# Patient Record
Sex: Female | Born: 1955 | Race: White | Hispanic: No | Marital: Married | Smoking: Never smoker
Health system: Southern US, Community
[De-identification: ages and names within clinical notes are randomized; demographics above are authoritative.]

## PROBLEM LIST (undated history)

## (undated) DIAGNOSIS — M7060 Trochanteric bursitis, unspecified hip: Secondary | ICD-10-CM

## (undated) DIAGNOSIS — G4710 Hypersomnia, unspecified: Secondary | ICD-10-CM

## (undated) DIAGNOSIS — G43909 Migraine, unspecified, not intractable, without status migrainosus: Secondary | ICD-10-CM

## (undated) DIAGNOSIS — N941 Unspecified dyspareunia: Secondary | ICD-10-CM

## (undated) DIAGNOSIS — G8929 Other chronic pain: Secondary | ICD-10-CM

## (undated) DIAGNOSIS — R519 Headache, unspecified: Secondary | ICD-10-CM

## (undated) DIAGNOSIS — K219 Gastro-esophageal reflux disease without esophagitis: Secondary | ICD-10-CM

## (undated) DIAGNOSIS — F419 Anxiety disorder, unspecified: Secondary | ICD-10-CM

## (undated) DIAGNOSIS — E559 Vitamin D deficiency, unspecified: Secondary | ICD-10-CM

## (undated) DIAGNOSIS — T7840XA Allergy, unspecified, initial encounter: Secondary | ICD-10-CM

## (undated) DIAGNOSIS — R51 Headache: Secondary | ICD-10-CM

## (undated) HISTORY — DX: Gastro-esophageal reflux disease without esophagitis: K21.9

## (undated) HISTORY — PX: WISDOM TOOTH EXTRACTION: SHX21

## (undated) HISTORY — DX: Anxiety disorder, unspecified: F41.9

## (undated) HISTORY — DX: Unspecified dyspareunia: N94.10

## (undated) HISTORY — DX: Other chronic pain: G89.29

## (undated) HISTORY — PX: OTHER SURGICAL HISTORY: SHX169

## (undated) HISTORY — DX: Headache: R51

## (undated) HISTORY — DX: Trochanteric bursitis, unspecified hip: M70.60

## (undated) HISTORY — DX: Allergy, unspecified, initial encounter: T78.40XA

## (undated) HISTORY — PX: LAPAROSCOPIC TOTAL HYSTERECTOMY: SUR800

## (undated) HISTORY — DX: Migraine, unspecified, not intractable, without status migrainosus: G43.909

## (undated) HISTORY — DX: Hypersomnia, unspecified: G47.10

## (undated) HISTORY — DX: Vitamin D deficiency, unspecified: E55.9

## (undated) HISTORY — PX: UPPER GASTROINTESTINAL ENDOSCOPY: SHX188

## (undated) HISTORY — PX: COLONOSCOPY: SHX174

## (undated) HISTORY — DX: Headache, unspecified: R51.9

---

## 1997-11-22 ENCOUNTER — Ambulatory Visit (HOSPITAL_COMMUNITY): Admission: RE | Admit: 1997-11-22 | Discharge: 1997-11-22 | Payer: Self-pay | Admitting: *Deleted

## 1998-05-07 ENCOUNTER — Ambulatory Visit (HOSPITAL_COMMUNITY): Admission: RE | Admit: 1998-05-07 | Discharge: 1998-05-07 | Payer: Self-pay | Admitting: Obstetrics & Gynecology

## 1998-05-07 ENCOUNTER — Encounter: Payer: Self-pay | Admitting: Obstetrics & Gynecology

## 2000-07-20 ENCOUNTER — Encounter: Payer: Self-pay | Admitting: Obstetrics & Gynecology

## 2000-07-20 ENCOUNTER — Ambulatory Visit (HOSPITAL_COMMUNITY): Admission: RE | Admit: 2000-07-20 | Discharge: 2000-07-20 | Payer: Self-pay | Admitting: Obstetrics & Gynecology

## 2001-02-15 ENCOUNTER — Other Ambulatory Visit: Admission: RE | Admit: 2001-02-15 | Discharge: 2001-02-15 | Payer: Self-pay | Admitting: Obstetrics & Gynecology

## 2003-06-11 ENCOUNTER — Other Ambulatory Visit: Admission: RE | Admit: 2003-06-11 | Discharge: 2003-06-11 | Payer: Self-pay | Admitting: Obstetrics & Gynecology

## 2004-11-25 ENCOUNTER — Other Ambulatory Visit: Admission: RE | Admit: 2004-11-25 | Discharge: 2004-11-25 | Payer: Self-pay | Admitting: Obstetrics & Gynecology

## 2005-01-29 ENCOUNTER — Ambulatory Visit: Payer: Self-pay | Admitting: Internal Medicine

## 2005-02-24 ENCOUNTER — Encounter
Admission: RE | Admit: 2005-02-24 | Discharge: 2005-02-24 | Payer: Self-pay | Admitting: Physical Medicine and Rehabilitation

## 2005-05-28 ENCOUNTER — Ambulatory Visit: Payer: Self-pay | Admitting: Internal Medicine

## 2006-06-10 ENCOUNTER — Ambulatory Visit (HOSPITAL_COMMUNITY): Admission: RE | Admit: 2006-06-10 | Discharge: 2006-06-10 | Payer: Self-pay | Admitting: Emergency Medicine

## 2007-01-18 ENCOUNTER — Encounter (INDEPENDENT_AMBULATORY_CARE_PROVIDER_SITE_OTHER): Payer: Self-pay | Admitting: Obstetrics & Gynecology

## 2007-01-18 ENCOUNTER — Ambulatory Visit (HOSPITAL_COMMUNITY): Admission: RE | Admit: 2007-01-18 | Discharge: 2007-01-19 | Payer: Self-pay | Admitting: Obstetrics & Gynecology

## 2010-09-01 NOTE — Op Note (Signed)
Michele [name], Michele [name]                 ACCOUNT NO.:  [id number]   MEDICAL RECORD NO.:  [id number]          PATIENT TYPE:  OIB   LOCATION:  9313                          FACILITY:  [facility]   PHYSICIAN:  Freddy Finner, M.D.   DATE OF BIRTH:  20-Jan-1956   DATE OF PROCEDURE:  DATE OF DISCHARGE:                               OPERATIVE REPORT   PREOPERATIVE DIAGNOSIS:  1. Uterine leiomyoma.  2. Suspected uterine adenomyosis by ultrasound findings.  3. Severe premenstrual disorder without hormonal medication and      antidepressants  4. Persistent right ovarian cyst.   POSTOPERATIVE DIAGNOSES:  1. Uterine leiomyoma.  2. Suspected uterine adenomyosis by ultrasound findings.  3. Severe premenstrual disorder without hormonal medication and      antidepressants  4. Persistent right ovarian cyst.   SECONDARY POSTOPERATIVE DIAGNOSES:  Peritoneal cyst of anterior  abdominal wall in the right upper quadrant.   OPERATIVE PROCEDURE:  Primary laparoscopically assisted vaginal  hysterectomy, bilateral salpingo-oophorectomy.   SECONDARY PROCEDURE:  Resection of right peritoneal upper abdominal  cyst.   ESTIMATED INTRAOPERATIVE BLOOD LOSS:  100 cc.   INTRAOPERATIVE COMPLICATIONS:  None.   ANESTHESIA:  General endotracheal.   DETAILS OF PRESENT ILLNESS:  Recorded in the admission note.  The  patient was admitted on the morning of surgery.  She was brought to the  operating room after receiving 1 g bolus of Ancef IV.  She was placed in  PAS hose.  She was brought to the operating room and there placed under  adequate endotracheal anesthesia.  Placed in the dorsal lithotomy  position with Allyn stirrups system.  Betadine prep of abdomen, perineum  and vagina was carried out in the standard fashion with Betadine scrub  followed by Betadine solution.  Bladder was evacuated with sterile  technique.  Hulka tenaculum was attached to the cervix under direct  visualization.  Sterile drapes were applied.   Two small incisions were  made, one at the umbilicus, one just about the symphysis.  An 11 mm  blade of disposable trocar was introduced at the upper incision while  elevating the anterior abdominal wall manually.  Direct inspection  revealed adequate placement, but no evidence of injury on entry.  The  peritoneum was allowed to accumulate with carbon dioxide gas.  A 5 mm  trocar was placed through a lower incision just above the symphysis  under direct visualization.  Again, no injury as encountered.  Systematic examination of the pelvic and abdominal contents was carried  out.  The only apparent upper abdominal abnormality was an approximately  2.5 x 2 cm clear-looking cystic structure attached to the peritoneum of  the anterior abdominal wall in a pedunculated fashion.  The appendix was  visualized which was normal.  The liver was normal.  No other  abnormalities were noted.  Pelvic contents revealed a boggy, irregular  enlarged uterus, cystically enlarged ovary, but there was no fixation,  no external excrescences.  No indication of malignancy.  The left ovary  and tube were normal.  The right tube was normal.  There was  a paratubal  cyst on the left nodule measuring approximately 1 cm.  Pelvic peritoneal  surfaces were clean.  Using the gyrus tripolar device, the cystic lesion  in the upper abdomen was fulgurated at its base and sharply dissected  free.  It was placed in the  cul-de-sac and retrieved during the vaginal  portion of the procedure and was sent separately for histologic  examination.  Elevating the uterus with the Hulka tenaculum and using a  spring loaded gasping forceps through the lower trocar sleeve for  traction and exposure, the right infundibular pelvic ligament and upper  broad ligament was progressively sealed and divided.  Left was then  treated essentially identically.  Hemostasis at this point was adequate.  Attention was turned vaginally.  Weighted posterior  vaginal retractor  was placed.  Beaver retractors were used laterally and anteriorly for  exposure.  The Hulka tenaculum was replaced with a Jacobs tenaculum.  Posterior weighted vaginal retractor was placed.  Mucosa posterior to  the cervix was tinted and entered sharply with Mayo scissors.  Cervix  was circumscribed with a scapula to release the cervix from the mucosa.  The cystic mass noted above was retrieved.  A banana posterior retractor  was then placed.  The LigaSure device was then used to seal and divide  the uterosacral pedicles on each side.  Lateral pelvis was similarly  sealed and divided to release the bladder from the cervix.  Careful  sharp and blunt dissection was then carried out to enter the anterior  peritoneal space which was confirmed by direct inspection.  Cardinal  ligament pedicles were then taken, sealed and divided with the LigaSure  device.  Vessel pedicles were taken, sealed and divided with the  LigaSure device.  Second pedicle was taken on each side above the  vessels.  This allowed delivery of the uterus, tubes and ovaries through  the vagina.  Angles of the vagina were then anchored to uterosacrals  with mattress sutures and 0-Monocryle.  Uterosacrals were plicated and  posterior peritoneum closed with interrupted 0-Monocryl.  Cuff was  closed vertically with figure of 8 0-Monocryl.  Foley catheter was  placed.  Reinspection was then carried out abdominally using the Nazat  irrigation system, and careful examination of all pedicles revealed  complete hemostasis.  This was confirmed under reduced intraabdominal  pressure.  The irrigating solution and gas were evacuated from the  abdominal cavity.  The skin incisions were anesthetized with 0.5% plain  Marcaine.  Incisions were closed with interrupted subcuticular sutures  of 3-0 Dexon.  Steri-Strips were applied to the lower incision, then  sterile dressing to the upper incision.  The patient was awakened  and  taken to the recovery room in good condition.      Freddy Finner, M.D.  Electronically Signed     WRN/MEDQ  D:  01/18/2007  T:  01/18/2007  Job:  [id number]

## 2010-09-01 NOTE — H&P (Signed)
Michele [name], Michele [name]                 ACCOUNT NO.:  [id number]   MEDICAL RECORD NO.:  [id number]          PATIENT TYPE:  AMB   LOCATION:  SDC                           FACILITY:  [facility]   PHYSICIAN:  Freddy Finner, M.D.   DATE OF BIRTH:  May 26, 1955   DATE OF ADMISSION:  01/17/2007  DATE OF DISCHARGE:                              HISTORY & PHYSICAL   ADMISSION DIAGNOSES:  Uterine leiomyomata.  Ultrasound findings  consistent with uterine adenomyosis.  Persistent cystic right adnexal  mass.  Significant premenstrual dysphoric disorder, somewhat related in  the past with oral contraceptives.   The patient is a 55 year old white married female, gravida 2, para 2;  who has significant gynecologic complaints and difficulty over time.  Primarily outlined as in the present illness.  There is persistence of a  right simple ovarian mass, which has been present essentially since  January 2008.  The patient has intermittently been somewhat controlled  with oral contraceptives for severe premenstrual dysphoric disorder; but  she has continued to have dysfunctional bleeding or intermittent  breakthrough bleeding, and significant premenstrual pain.  Given the  constellation of all of these symptoms, plus the ultrasound findings of  cyst, adenomyosis and fibroids, the patient has elected to proceed with  definitive surgical intervention and is admitted now for  laparoscopically-assisted vaginal hysterectomy and bilateral salpingo-  oophorectomy.   The potential risks of the procedures, including the possibility of  converting to an abdominal incision for completion of the procedure; the  potential for hemorrhage, infection and injury to other organs has been  discussed -- even though it was presented as unusual event.  She is now  noted and prepared to proceed with surgery.   Her current review of systems is otherwise negative.  There are no  specific cardiopulmonary, GI or GU complaints.   PAST  MEDICAL HISTORY:  The patient has no known significant medical  illnesses.  She does have mood disorders, which are cyclic and do  require additional medication.   MEDICATIONS:  1. Cymbalta 30 mg 1/60 mg.  2. Zegerid daily.  3. Ambien 10 mg at bedtime as needed for sleep.  4. Atenolol 50 mg daily for irregular heart rhythm.   She has given birth to 2 children vaginally.  She did have one  spontaneous incomplete AB, completed by D&[facility].   ALLERGIES:  NO KNOWN DRUG ALLERGIES.   She has had no blood transfusions.  She is not a cigarette smoker.  In  the past she had clinical findings consistent with mitral valve  prolapse, but those findings are not present on physical examination for  surgery.   FAMILY HISTORY:  Noncontributory.   PHYSICAL EXAMINATION:  HEENT:  Within normal limits.  NECK:  The thyroid gland is not palpably enlarged.  Blood pressure (in the office) 102/60.  CHEST:  Clear to auscultation.  HEART:  Normal sinus rhythm without murmurs, rubs or gallops.  Done on  examination day prior to surgery.  BREAST:  Examination is considered to be normal.  No palpable masses.  No skin change or nipple  discharge.  PELVIC:  External genitalia, vagina and cervix are normal to inspection.  Bimanual reveals the uterus to be irregular, slightly enlarged.  There  is a cystic fullness in the right adnexa.  Rectovaginal examination  confirms these findings.  EXTREMITIES:  Without clubbing, cyanosis or edema.   ASSESSMENT:  1. Uterine leiomyomata.  2. Probable uterine adenomyosis.  3. 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  4. Persistent simple appearing right ovarian cyst.  5. Premenstrual dysphoric disorder, which is obviously hormonally      mediated.   PLAN:  Laparoscopically-assisted vaginal hysterectomy and bilateral  salpingo-oophorectomy.      Freddy Finner, M.D.  Electronically Signed     WRN/MEDQ  D:  01/17/2007  T:  01/17/2007  Job:  [id number]

## 2010-09-04 NOTE — Discharge Summary (Signed)
[name], [name]                 ACCOUNT NO.:  [id number]   MEDICAL RECORD NO.:  [id number]          PATIENT TYPE:  OIB   LOCATION:  9313                          FACILITY:  [facility]   PHYSICIAN:  Freddy Finner, M.D.   DATE OF BIRTH:  [birth year]   DATE OF ADMISSION:  01/18/2007  DATE OF DISCHARGE:  01/19/2007                               DISCHARGE SUMMARY   DISCHARGE DIAGNOSES:  1. Persistent right ovarian mass which was a simple serous cyst.  2. Uterine adenomyosis.  3. Intramural leiomyomata.  4. Clinical symptoms of persistent right adnexal pain.  5. Significant premenstrual dysphoric disorder incompletely managed      with oral contraceptives.  6. Dysfunctional uterine bleeding.  7. Recurrent pelvic pain.   OPERATIVE PROCEDURE:  Laparoscopically-assisted vaginal hysterectomy,  bilateral salpingo-oophorectomy.   INTRAOPERATIVE AND POSTOPERATIVE COMPLICATIONS:  None.   DISPOSITION:  The patient was in satisfactory improved condition on the  first postoperative day, she was ambulating without assistance.  She was  having adequate bowel and bladder function.  She was tolerating a  regular diet.  Her condition was considered to be good.  She was  discharged home for follow-up in the office in approximately two weeks.  She was given Percocet 5/325 for pain.  She is to use adjuvant ibuprofen  with, or in place of, Percocet.  She is to continue her estrogen  replacement therapy.  She is to resume any and all preoperative  medications.   LABORATORY DATA DURING THIS ADMISSION:  A normal admission CBC,  postoperative hemoglobin on the first postoperative day was 11.2.  Admission prothrombin time and PTT were normal.   HOSPITAL COURSE:  The patient was admitted on the morning of surgery.  She was given perioperative Ancef.  She was placed in PAS serial  compression hose for the lower extremities.  She was brought to the  operating room where the above-described procedure was  accomplished  without difficulty.  There were no intraoperative complications.  Her  postoperative course was entirely satisfactory.  She remained afebrile  throughout her hospital stay.  By the first postoperative day, her  condition was good.  She was taking a regular diet.  She was discharged  home with further disposition as noted above.      Freddy Finner, M.D.  Electronically Signed     WRN/MEDQ  D:  04/29/2007  T:  04/30/2007  Job:  [id number]

## 2011-01-28 LAB — PROTIME-INR
INR: 0.9
Prothrombin Time: 12.1

## 2011-01-28 LAB — CBC
HCT: 32.8 — ABNORMAL LOW
HCT: 38.5
Hemoglobin: 11.2 — ABNORMAL LOW
Hemoglobin: 13.1
MCHC: 34
MCHC: 34.1
MCV: 94.5
MCV: 95
Platelets: 272
RBC: 3.47 — ABNORMAL LOW
RBC: 4.05
RDW: 12.5
RDW: 12.9
WBC: 7.5

## 2016-04-26 DIAGNOSIS — Z01419 Encounter for gynecological examination (general) (routine) without abnormal findings: Secondary | ICD-10-CM | POA: Diagnosis not present

## 2016-04-26 DIAGNOSIS — Z6822 Body mass index (BMI) 22.0-22.9, adult: Secondary | ICD-10-CM | POA: Diagnosis not present

## 2016-05-06 DIAGNOSIS — Z0000 Encounter for general adult medical examination without abnormal findings: Secondary | ICD-10-CM | POA: Diagnosis not present

## 2016-05-26 DIAGNOSIS — Z0000 Encounter for general adult medical examination without abnormal findings: Secondary | ICD-10-CM | POA: Diagnosis not present

## 2016-05-26 DIAGNOSIS — R0789 Other chest pain: Secondary | ICD-10-CM | POA: Diagnosis not present

## 2016-05-26 DIAGNOSIS — E559 Vitamin D deficiency, unspecified: Secondary | ICD-10-CM | POA: Diagnosis not present

## 2016-05-26 DIAGNOSIS — Z1389 Encounter for screening for other disorder: Secondary | ICD-10-CM | POA: Diagnosis not present

## 2016-05-26 DIAGNOSIS — Z23 Encounter for immunization: Secondary | ICD-10-CM | POA: Diagnosis not present

## 2016-07-16 DIAGNOSIS — H2513 Age-related nuclear cataract, bilateral: Secondary | ICD-10-CM | POA: Diagnosis not present

## 2016-07-16 DIAGNOSIS — H04123 Dry eye syndrome of bilateral lacrimal glands: Secondary | ICD-10-CM | POA: Diagnosis not present

## 2016-07-16 DIAGNOSIS — H25013 Cortical age-related cataract, bilateral: Secondary | ICD-10-CM | POA: Diagnosis not present

## 2016-08-20 DIAGNOSIS — M859 Disorder of bone density and structure, unspecified: Secondary | ICD-10-CM | POA: Diagnosis not present

## 2016-09-20 ENCOUNTER — Other Ambulatory Visit: Payer: Self-pay | Admitting: Internal Medicine

## 2016-09-20 DIAGNOSIS — R51 Headache: Principal | ICD-10-CM

## 2016-09-20 DIAGNOSIS — R519 Headache, unspecified: Secondary | ICD-10-CM

## 2016-10-02 ENCOUNTER — Ambulatory Visit
Admission: RE | Admit: 2016-10-02 | Discharge: 2016-10-02 | Disposition: A | Discharge status: Home / Self Care | Payer: 59 | Source: Ambulatory Visit | Attending: Internal Medicine | Admitting: Internal Medicine

## 2016-10-02 DIAGNOSIS — R51 Headache: Principal | ICD-10-CM

## 2016-10-02 DIAGNOSIS — R519 Headache, unspecified: Secondary | ICD-10-CM

## 2016-11-19 DIAGNOSIS — G43109 Migraine with aura, not intractable, without status migrainosus: Secondary | ICD-10-CM | POA: Diagnosis not present

## 2016-11-19 DIAGNOSIS — G43709 Chronic migraine without aura, not intractable, without status migrainosus: Secondary | ICD-10-CM | POA: Diagnosis not present

## 2017-01-03 DIAGNOSIS — Z23 Encounter for immunization: Secondary | ICD-10-CM | POA: Diagnosis not present

## 2017-03-07 DIAGNOSIS — Z23 Encounter for immunization: Secondary | ICD-10-CM | POA: Diagnosis not present

## 2017-05-05 DIAGNOSIS — Z01419 Encounter for gynecological examination (general) (routine) without abnormal findings: Secondary | ICD-10-CM | POA: Diagnosis not present

## 2017-05-05 DIAGNOSIS — Z6821 Body mass index (BMI) 21.0-21.9, adult: Secondary | ICD-10-CM | POA: Diagnosis not present

## 2017-05-05 DIAGNOSIS — Z1231 Encounter for screening mammogram for malignant neoplasm of breast: Secondary | ICD-10-CM | POA: Diagnosis not present

## 2018-02-03 DIAGNOSIS — R82998 Other abnormal findings in urine: Secondary | ICD-10-CM | POA: Diagnosis not present

## 2018-02-03 DIAGNOSIS — Z0000 Encounter for general adult medical examination without abnormal findings: Secondary | ICD-10-CM | POA: Diagnosis not present

## 2018-02-09 DIAGNOSIS — Z0000 Encounter for general adult medical examination without abnormal findings: Secondary | ICD-10-CM | POA: Diagnosis not present

## 2018-02-09 DIAGNOSIS — G43909 Migraine, unspecified, not intractable, without status migrainosus: Secondary | ICD-10-CM | POA: Diagnosis not present

## 2018-02-09 DIAGNOSIS — R0789 Other chest pain: Secondary | ICD-10-CM | POA: Diagnosis not present

## 2018-02-09 DIAGNOSIS — Z1389 Encounter for screening for other disorder: Secondary | ICD-10-CM | POA: Diagnosis not present

## 2018-02-09 DIAGNOSIS — N941 Unspecified dyspareunia: Secondary | ICD-10-CM | POA: Diagnosis not present

## 2018-03-03 DIAGNOSIS — Z1212 Encounter for screening for malignant neoplasm of rectum: Secondary | ICD-10-CM | POA: Diagnosis not present

## 2018-03-16 IMAGING — MR MR HEAD W/O CM
10 series · 48 of 48 positions shown · non-contrast
Comparison: None.

CLINICAL DATA: Non intractable headache

EXAM:
MRI HEAD WITHOUT CONTRAST
TECHNIQUE: Multiplanar, multiecho pulse sequences of the brain and surrounding
structures were obtained without intravenous contrast.

[Series 2: t1_se_sag · sagittal · 5.0mm · 0.45mm/px · 3 of 21 slices shown]
[im 1/21]
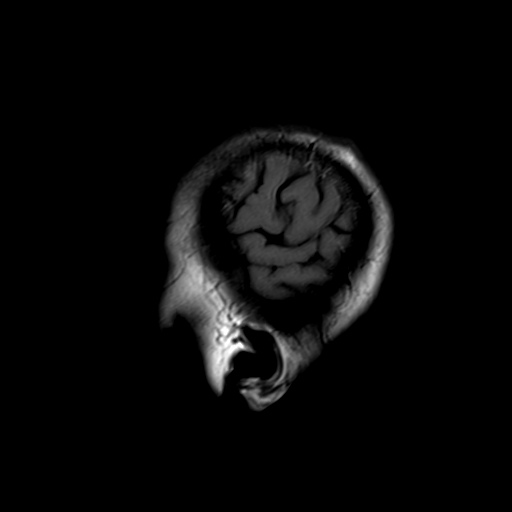
[im 11/21]
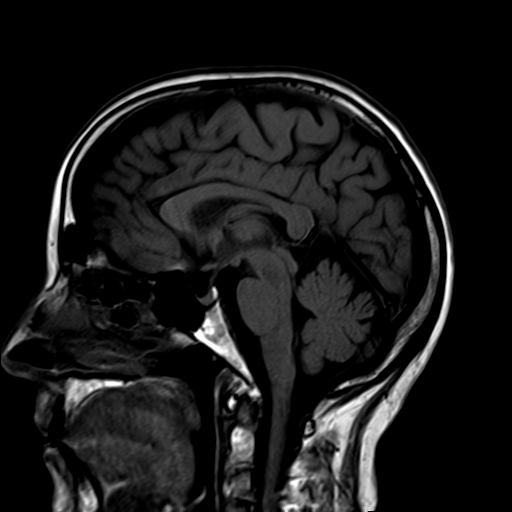
[im 21/21]
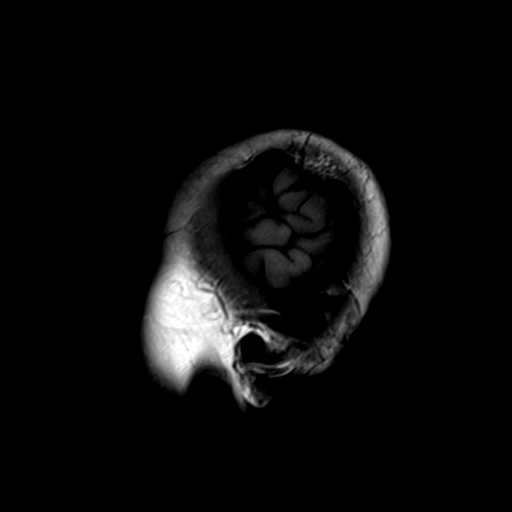

[Series 3: ep2d_diff_(id)_trace · axial · 3.0mm · 1.80mm/px · z∈[-43,+96]mm · 8 of 95 slices shown]
[im 1/95]
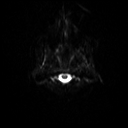
[im 14/95]
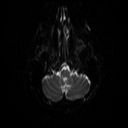
[im 27/95]
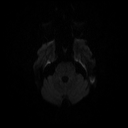
[im 41/95]
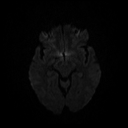
[im 54/95]
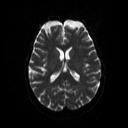
[im 68/95]
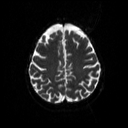
[im 81/95]
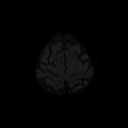
[im 95/95]
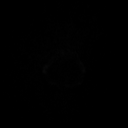

[Series 4: ep2d_diff_(id)_trace_adc · axial · 3.0mm · 1.80mm/px · z∈[-43,+96]mm · 4 of 48 slices shown]
[im 1/48]
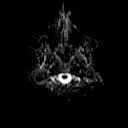
[im 16/48]
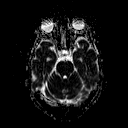
[im 32/48]
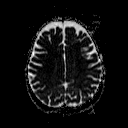
[im 48/48]
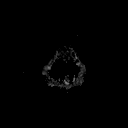

[Series 5: ep2d_diff_cor · coronal · 5.0mm · 1.77mm/px · 4 of 48 slices shown]
[im 1/48]
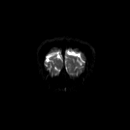
[im 16/48]
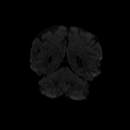
[im 32/48]
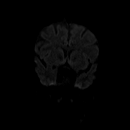
[im 48/48]
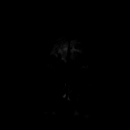

[Series 6: ep2d_diff_cor_adc · coronal · 5.0mm · 1.77mm/px · 2 of 24 slices shown]
[im 1/24]
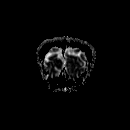
[im 24/24]
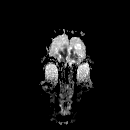

[Series 8: swi_images · axial · 2.0mm · 0.90mm/px · z∈[-53,+103]mm · 7 of 80 slices shown]
[im 1/80]
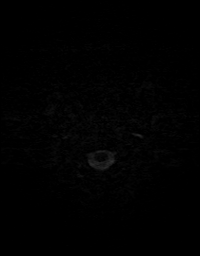
[im 14/80]
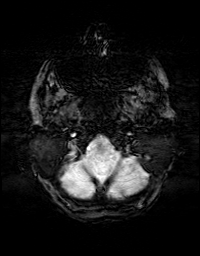
[im 27/80]
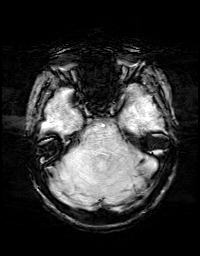
[im 40/80]
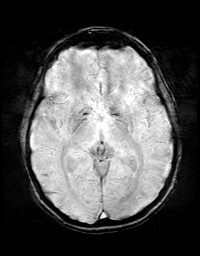
[im 53/80]
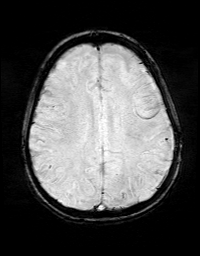
[im 66/80]
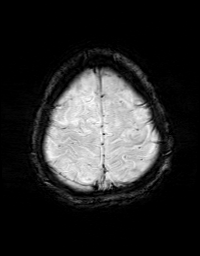
[im 80/80]
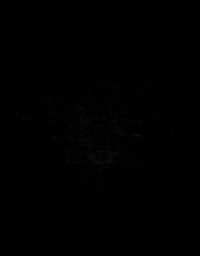

[Series 9: FLAIR · axial · 3.0mm · 0.43mm/px · z∈[-51,+103]mm · 2 of 27 slices shown]
[im 1/27]
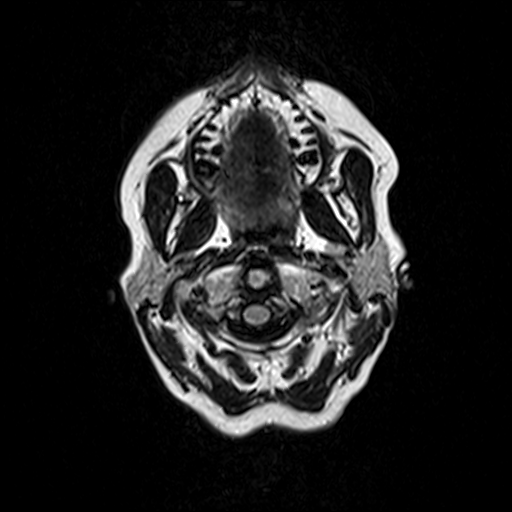
[im 27/27]
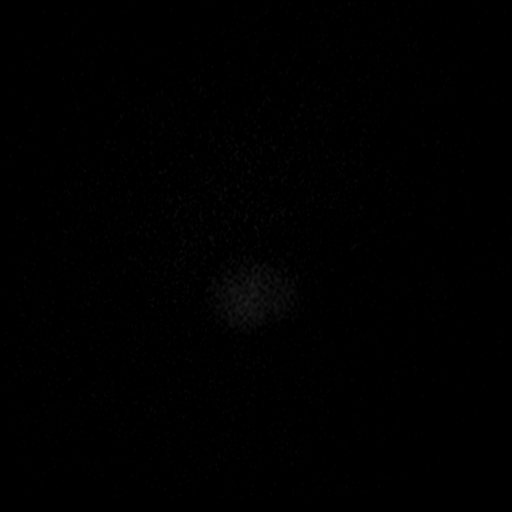

[Series 10: t2_tse_tra_512 · axial · 5.0mm · 0.60mm/px · z∈[-43,+93]mm · 2 of 24 slices shown]
[im 1/24]
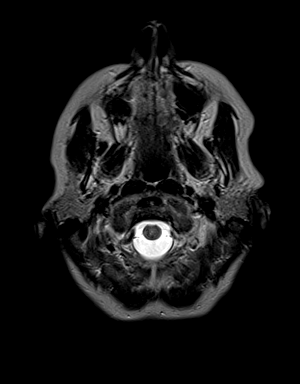
[im 24/24]
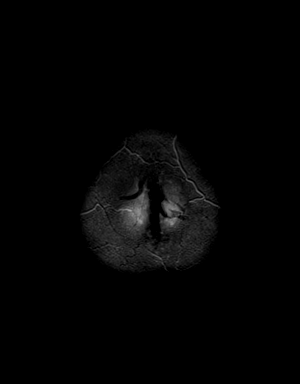

[Series 11: t1_mpr_tra · axial · 1.0mm · 0.72mm/px · z∈[-53,+104]mm · 14 of 160 slices shown]
[im 1/160]
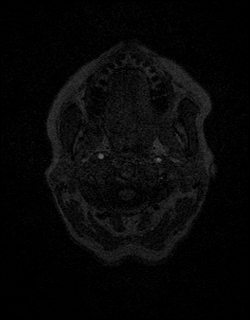
[im 13/160]
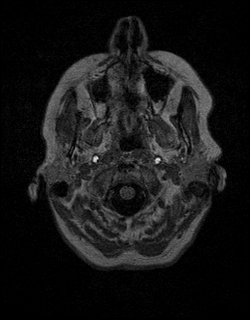
[im 25/160]
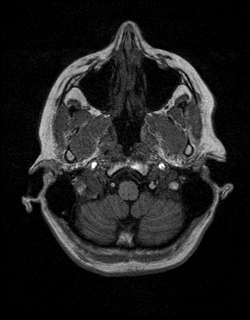
[im 37/160]
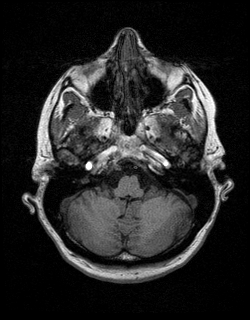
[im 49/160]
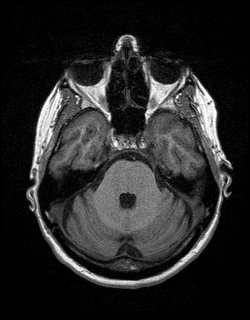
[im 62/160]
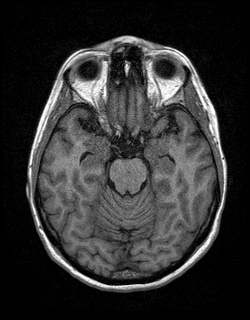
[im 74/160]
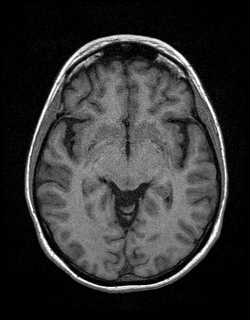
[im 86/160]
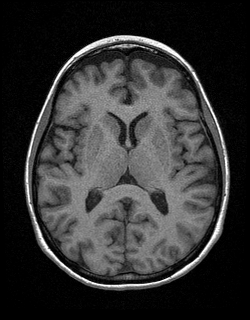
[im 98/160]
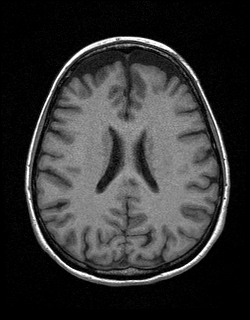
[im 111/160]
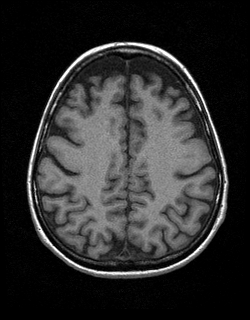
[im 123/160]
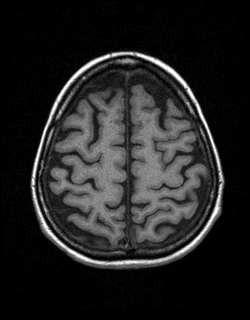
[im 135/160]
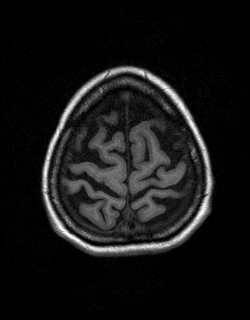
[im 147/160]
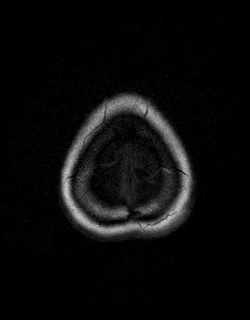
[im 160/160]
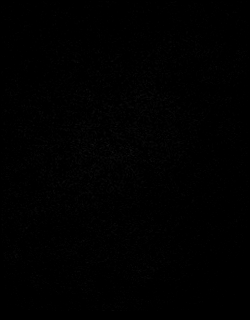

[Series 12: T2 · coronal · 5.0mm · 0.45mm/px · 2 of 26 slices shown]
[im 1/26]
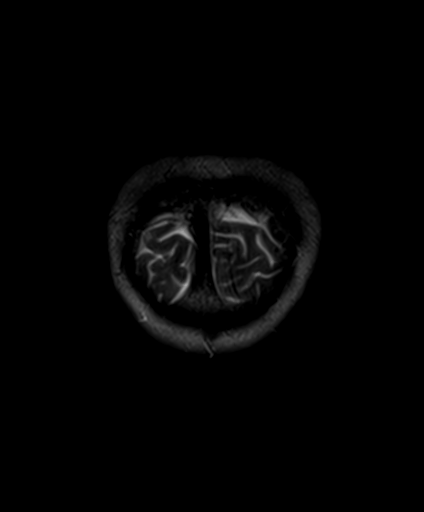
[im 26/26]
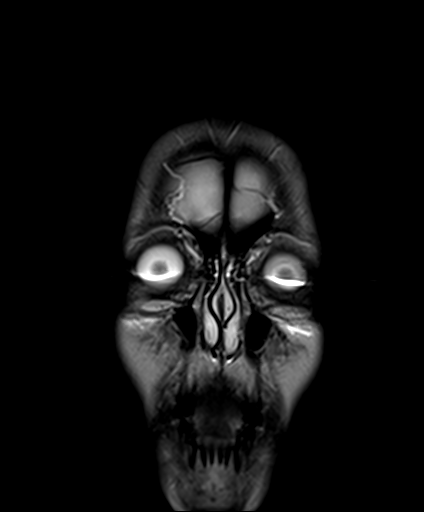

[48 of 48 positions shown; findings below may reference images not displayed]

FINDINGS: Brain: No acute infarction, hemorrhage, hydrocephalus, extra-axial
collection or mass lesion.

Vascular: Normal flow voids.

Skull and upper cervical spine: Negative

Sinuses/Orbits: Negative

Other: None
IMPRESSION: Negative MRI head

## 2018-07-11 DIAGNOSIS — G43709 Chronic migraine without aura, not intractable, without status migrainosus: Secondary | ICD-10-CM | POA: Diagnosis not present

## 2018-07-11 DIAGNOSIS — G43109 Migraine with aura, not intractable, without status migrainosus: Secondary | ICD-10-CM | POA: Diagnosis not present

## 2018-07-19 ENCOUNTER — Encounter: Payer: Self-pay | Admitting: *Deleted

## 2018-07-19 ENCOUNTER — Telehealth: Payer: Self-pay | Admitting: *Deleted

## 2018-07-19 NOTE — Telephone Encounter (Signed)
Called pt & LVM asking for call back. Wanted to review patient's information in chart (I.e. meds, allergies, med/sx/fam hx, etc). Left office number in message.

## 2018-07-20 ENCOUNTER — Ambulatory Visit (INDEPENDENT_AMBULATORY_CARE_PROVIDER_SITE_OTHER): Payer: 59 | Admitting: Neurology

## 2018-07-20 ENCOUNTER — Encounter: Payer: Self-pay | Admitting: *Deleted

## 2018-07-20 ENCOUNTER — Other Ambulatory Visit: Payer: Self-pay

## 2018-07-20 DIAGNOSIS — G43711 Chronic migraine without aura, intractable, with status migrainosus: Secondary | ICD-10-CM

## 2018-07-20 MED ORDER — VENLAFAXINE HCL ER 75 MG PO CP24: 75.0000 mg | ORAL_CAPSULE | Freq: Every day | ORAL | 4 refills | Status: DC

## 2018-07-20 MED ORDER — METHYLPREDNISOLONE 4 MG PO TBPK: ORAL_TABLET | ORAL | 1 refills | Status: DC

## 2018-07-20 NOTE — Telephone Encounter (Signed)
We received the synopsis via fax and will give to Dr. Lucia Gaskins for review.

## 2018-07-20 NOTE — Telephone Encounter (Signed)
She would also like to confirm that Dr. Lucia Gaskins received the synopsis of her medical history, she would like for this to be reviewed before her visit.

## 2018-07-20 NOTE — Telephone Encounter (Signed)
Patient returned call she does not have an email regarding her web ex and needs the link sent to her. She downloaded web ex on her own but cannot join with our doctor. [email or web address].

## 2018-07-20 NOTE — Progress Notes (Signed)
[facility]    Provider:  Dr Lucia Gaskins Requesting Provider: Rodrigo Ran, MD Primary Care Provider:  Rodrigo Ran, MD  CC:  Intractable migraines  Virtual Visit via Video Note  I connected with@ on 07/24/18 at  3:30 PM EDT by a video enabled telemedicine application and verified that I am speaking with the correct person using two identifiers. Patient is at her work office and physician is in the office as well.    I discussed the limitations of evaluation and management by telemedicine and the availability of in person appointments. The patient expressed understanding and agreed to proceed.  Anson Fret, MD  HPI:  Michele [name] is a 63 y.o. female here as requested by Rodrigo Ran, MD for migraines. Patient has had migraines for years and has tried many medications. Currently, she has had a 9-week migraine that waxes and wanes but is daily. She has missed work seeing patients 3 times She has been in status migrainosus in the past but  this is the first time she has had a migraine this long and intractable. The last time she had imaging was in 2018 and normal. The migraine quality can be different, she does not have auras and she has tried all the triptans. Migraines are unilateral through the eye, pulsating and pounding, photo/phonophobia, movement makes it worse. Light is a trigger. Other times they can be all over the head. +nausea often, when she does get nausea she is incapacitated 3x in the last 9 weeks. Migraines are severe or moderately severe.  Atenolol helps.  She has 18 headache days a month on average and 10 migraine days a month for longer than one year. Over the last 9 weeks daily migraines. She tried aimovig for 3 months last fall. She tried Emgality 2 doses and then the headache started for 9 weeks. She is not on CGRP at this time. She tried baclofen and compazine but could not tolerate it. Compazine made her stomach feel worse. Toradol makes her sick. She is  not tolerant of any of the triptans. No medication overuse. Migraines can last a minimum of 24 hours.  No other focal neurologic deficits, associated symptoms, inciting events or modifiable factors.   Meds tried (this is not a complete list) : Zofran, tizanidine, atenolol, celexa, topiramate, propranolol, lopressor, phenergan, flexeril, lamictal, cymbalta, effexor, prozac, viibryd  Reviewed notes, labs and imaging from outside physicians, which showed:  She also takes Zofran  for nausea Tizanidine  for aches and pains and is helpful Atenolol  Celexa  QD  Current and past medications ANALGESICS:demerol,tylenol,ultram,vicodin ANTI-MIGRAINE: axert, cafergot, DHE-45, imitrex tablet/injection, maxalt, midrin. She tells me she does not tolerate triptans well.  HEART/BP: lopressor, atenolol (very helpful) DECONGESTANT/ANTIHISTAMINE: allegra, clarinex, zyrtec ANTI-NAUSEANT: phenergan, vistaril, zofran NSAIDS: ibuprofen, naproxen, mobic, toradol MUSCLE RELAXANTS: amrix, flexeril, tizanidine ANTI-CONVULSANTS: topamax (expressive aphasia), Klonopin, Lamictal STEROIDS: medrol SLEEPING PILLS/TRANQUILIZERS:ambien, melatonin, xanax  ANTI-DEPRESSANTS: celexa, cymbalta, effexor (increased anxiety) pristiq, prozac, viibryd HERBAL: magnesium citrate (GI upset) FIBROMYALGIA:  HORMONAL: OTHER:  PROCEDURES FOR HEADACHES:   Review of Systems: Patient complains of symptoms per HPI as well as the following symptoms: headaches, nausea. Pertinent negatives and positives per HPI. All others negative.   Social History   Socioeconomic History   Marital status: Married    Spouse name: Not on file   Number of children: 2   Years of education: Not on file   Highest education level: Master's degree (e.g., MA, MS, MEng, MEd, MSW, MBA)  Occupational  History   Not on file  Social Needs   Financial resource strain: Not on file   Food insecurity:    Worry: Not on file    Inability: Not  on file   Transportation needs:    Medical: Not on file    Non-medical: Not on file  Tobacco Use   Smoking status: Never Smoker   Smokeless tobacco: Never Used  Substance and Sexual Activity   Alcohol use: Never    Frequency: Never   Drug use: Never   Sexual activity: Not on file  Lifestyle   Physical activity:    Days per week: Not on file    Minutes per session: Not on file   Stress: Not on file  Relationships   Social connections:    Talks on phone: Not on file    Gets together: Not on file    Attends religious service: Not on file    Active member of club or organization: Not on file    Attends meetings of clubs or organizations: Not on file    Relationship status: Not on file   Intimate partner violence:    Fear of current or ex partner: Not on file    Emotionally abused: Not on file    Physically abused: Not on file    Forced sexual activity: Not on file  Other Topics Concern   Not on file  Social History Narrative   Lives at home with spouse   Right handed   Caffeine: 2 cups daily     Family History  Problem Relation Age of Onset   Lung cancer Father    Headache Father    Other Sister        ureter tumor, smoking related   Allergic Disorder Brother    Anxiety disorder Brother     Past Medical History:  Diagnosis Date   Anxiety    Chronic headaches    Dyspareunia in female    Greater trochanteric bursitis    bilateral   Hypersomnia    Migraines    Vitamin D deficiency    in the past per pt    There are no active problems to display for this patient.   Past Surgical History:  Procedure Laterality Date   COLONOSCOPY     endoscopy     LAPAROSCOPIC TOTAL HYSTERECTOMY     WISDOM TOOTH EXTRACTION      Current Outpatient Medications  Medication Sig Dispense Refill   ALPRAZolam (XANAX) 1 MG tablet Take 0.5 mg by mouth as needed for anxiety.     Armodafinil 250 MG tablet Take 125 mg by mouth 2 (two) times daily.       atenolol (TENORMIN) 100 MG tablet Take 1 tablet (100 mg total) by mouth daily. 90 tablet 11   citalopram (CELEXA) 40 MG tablet Take 40 mg by mouth at bedtime.     estradiol (VIVELLE-DOT) 0.05 MG/24HR patch Place 1 patch onto the skin 2 (two) times a week.     methylPREDNISolone (MEDROL DOSEPAK) 4 MG TBPK tablet Take pills together each morning with food for 6 days 21 tablet 1   ondansetron (ZOFRAN) 4 MG tablet Take 4 mg by mouth as needed for nausea or vomiting.     traMADol (ULTRAM) 50 MG tablet Take 50 mg by mouth as needed (pain).     Ubrogepant (UBRELVY) 50 MG TABS Take 100 mg by mouth every 2 (two) hours as needed. Max  a day 20 tablet 6  venlafaxine XR (EFFEXOR XR) 75 MG 24 hr capsule Take 1 capsule (75 mg total) by mouth daily with breakfast. 90 capsule 4   No current facility-administered medications for this visit.     Allergies as of 07/20/2018   (No Known Allergies)    Vitals: There were no vitals taken for this visit. Last Weight:  Wt Readings from Last 1 Encounters:  07/20/18 125 lb (56.7 kg)   Last Height:   Ht Readings from Last 1 Encounters:  07/20/18 5\' 6"  (1.676 m)     Physical exam: Exam:    Physical exam: Exam: Gen: NAD, conversant      CV: attempted, Could not perform over Web Video  Eyes: Conjunctivae clear without exudates or hemorrhage  Neuro: Detailed Neurologic Exam  Speech:    Speech is normal; fluent and spontaneous with normal comprehension.  Cognition:    The patient is oriented to person, place, and time;     recent and remote memory intact;     language fluent;     normal attention, concentration,     fund of knowledge Cranial Nerves:    The pupils are equal, round, and reactive to light. Attempted, Cannot perform fundoscopic exam. Visual fields are full to finger confrontation. Extraocular movements are intact.  The face is symmetric with normal sensation. The palate elevates in the midline. Hearing intact. Voice is  normal. Shoulder shrug is normal. The tongue has normal motion without fasciculations.   Coordination:    Normal finger to nose  Gait:    Normal native gait  Motor Observation:   no involuntary movements noted. Tone:    Appears normal  Posture:    Posture is normal. normal erect    Strength:    Strength is anti-gravity and symmetric in the upper and lower limbs.      Sensation: intact to LT     Reflex Exam:  DTR's:    Attempted, Could not perform over Web Video   Toes: Attempted Could not perform over Web Video  Clonus:   Attempted, Could not perform over Web Video     Assessment/Plan:  63 year old patient with intractable headaches. FAILED CGRP(Aimovig, Emgality) and is no long on these meds.  Meds tried (this is not a complete list) : Zofran, tizanidine, atenolol, celexa, topiramate, propranolol, lopressor, phenergan, flexeril, lamictal, cymbalta, effexor, prozac, viibryd   - Patient is an excellent candidate for botox, we will request authorization. - FAILED CGRP, not on them (can consider Ajovy* in the future) - Does not tolerate triptans. Prescribe Bernita Raisin - Increase Atenolol to 100mg  - change celexa to effexor.  Follow Up Instructions:    I discussed the assessment and treatment plan with the patient. The patient was provided an opportunity to ask questions and all were answered. The patient agreed with the plan and demonstrated an understanding of the instructions.   The patient was advised to call back or seek an in-person evaluation if the symptoms worsen or if the condition fails to improve as anticipated.   Meds ordered this encounter  Medications   venlafaxine XR (EFFEXOR XR) 75 MG 24 hr capsule    Sig: Take 1 capsule (75 mg total) by mouth daily with breakfast.    Dispense:  90 capsule    Refill:  4   methylPREDNISolone (MEDROL DOSEPAK) 4 MG TBPK tablet    Sig: Take pills together each morning with food for 6 days    Dispense:  21 tablet  Refill:  1   DISCONTD: Ubrogepant (UBRELVY) 50 MG TABS    Sig: Take 100 mg by mouth every 2 (two) hours as needed. Max 200mg  a day    Dispense:  20 tablet    Refill:  6    Patient has a copay card can have medication regardless of insurance coverage. If copay card only works for 10 tabs then it is ok to dispense 10 instead of 20.   Ubrogepant (UBRELVY) 50 MG TABS    Sig: Take 100 mg by mouth every 2 (two) hours as needed. Max 200mg  a day    Dispense:  20 tablet    Refill:  6    Patient has a copay card can have medication regardless of insurance coverage. If copay card only works for 10 tabs then it is ok to dispense 10 instead of 20.   DISCONTD: atenolol (TENORMIN) 100 MG tablet    Sig: Take 1 tablet (100 mg total) by mouth daily.    Dispense:  90 tablet    Refill:  11   atenolol (TENORMIN) 100 MG tablet    Sig: Take 1 tablet (100 mg total) by mouth daily.    Dispense:  90 tablet    Refill:  11    Cc: Rodrigo Ran, MD   Discussed: To prevent or relieve headaches, try the following: Cool Compress. Lie down and place a cool compress on your head.  Avoid headache triggers. If certain foods or odors seem to have triggered your migraines in the past, avoid them. A headache diary might help you identify triggers.  Include physical activity in your daily routine. Try a daily walk or other moderate aerobic exercise.  Manage stress. Find healthy ways to cope with the stressors, such as delegating tasks on your to-do list.  Practice relaxation techniques. Try deep breathing, yoga, massage and visualization.  Eat regularly. Eating regularly scheduled meals and maintaining a healthy diet might help prevent headaches. Also, drink plenty of fluids.  Follow a regular sleep schedule. Sleep deprivation might contribute to headaches Consider biofeedback. With this mind-body technique, you learn to control certain bodily functions -- such as muscle tension, heart rate and blood pressure -- to  prevent headaches or reduce headache pain.    Proceed to emergency room if you experience new or worsening symptoms or symptoms do not resolve, if you have new neurologic symptoms or if headache is severe, or for any concerning symptom.   Provided education and documentation from American headache Society toolbox including articles on: chronic migraine medication overuse headache, chronic migraines, prevention of migraines, behavioral and other nonpharmacologic treatments for headache.   A total of 60 minutes was spent Video face-to-face(not in person) with this patient. Over half this time was spent on counseling patient on the  1. Chronic migraine without aura, with intractable migraine, so stated, with status migrainosus    diagnosis and different diagnostic and therapeutic options, counseling and coordination of care, risks ans benefits of management, compliance, or risk factor reduction and education.     Naomie Dean, MD  [facility] [address] [place], [place] [address]  Phone [phone] Fax [phone]

## 2018-07-20 NOTE — Telephone Encounter (Signed)
Spoke with pt and confirmed pt using 2 identifiers. Updated her med/sx/soc/fam hx, meds, allergies, weight/height updated in chart. Pt faxing over her synopsis for Dr. Lucia Gaskins to review asap.

## 2018-07-24 ENCOUNTER — Ambulatory Visit: Payer: 59 | Admitting: Neurology

## 2018-07-24 ENCOUNTER — Telehealth: Payer: Self-pay | Admitting: Neurology

## 2018-07-24 ENCOUNTER — Other Ambulatory Visit: Payer: Self-pay

## 2018-07-24 ENCOUNTER — Encounter: Payer: Self-pay | Admitting: Neurology

## 2018-07-24 VITALS — BP 118/71 | HR 69 | Temp 99.3°F

## 2018-07-24 DIAGNOSIS — R51 Headache with orthostatic component, not elsewhere classified: Secondary | ICD-10-CM

## 2018-07-24 DIAGNOSIS — M7918 Myalgia, other site: Secondary | ICD-10-CM | POA: Diagnosis not present

## 2018-07-24 DIAGNOSIS — G441 Vascular headache, not elsewhere classified: Secondary | ICD-10-CM

## 2018-07-24 DIAGNOSIS — G43711 Chronic migraine without aura, intractable, with status migrainosus: Secondary | ICD-10-CM | POA: Insufficient documentation

## 2018-07-24 DIAGNOSIS — H538 Other visual disturbances: Secondary | ICD-10-CM

## 2018-07-24 DIAGNOSIS — H532 Diplopia: Secondary | ICD-10-CM

## 2018-07-24 DIAGNOSIS — R519 Headache, unspecified: Secondary | ICD-10-CM

## 2018-07-24 DIAGNOSIS — M542 Cervicalgia: Secondary | ICD-10-CM | POA: Diagnosis not present

## 2018-07-24 DIAGNOSIS — G4484 Primary exertional headache: Secondary | ICD-10-CM

## 2018-07-24 DIAGNOSIS — G8929 Other chronic pain: Secondary | ICD-10-CM

## 2018-07-24 DIAGNOSIS — G43001 Migraine without aura, not intractable, with status migrainosus: Secondary | ICD-10-CM | POA: Insufficient documentation

## 2018-07-24 DIAGNOSIS — H5713 Ocular pain, bilateral: Secondary | ICD-10-CM

## 2018-07-24 MED ORDER — ATENOLOL 100 MG PO TABS: 100.0000 mg | ORAL_TABLET | Freq: Every day | ORAL | 11 refills | Status: DC

## 2018-07-24 MED ORDER — UBROGEPANT 50 MG PO TABS: 100.0000 mg | ORAL_TABLET | ORAL | 6 refills | Status: DC | PRN

## 2018-07-24 NOTE — Progress Notes (Signed)
Nerve block w/o steroid: Pt signed consent  0.5% Bupivocaine 6 mL LOT: [id number] EXP: 11/2019 NDC: 70141-030-13  2% Lidocaine 6 mL LOT: [id number] EXP: 11/18/2018 NDC: 1438-8875-79 // Botox- 200 units x 1 vial Pt signed consent Lot: [id number] Expiration: 12/2019 NDC: 1561-5379-43  Bacteriostatic 0.9% Sodium Chloride- 48mL total Lot: [id number] Expiration: 01/18/2019 NDC: 0929-5747-34  Dx: G43.711 Samples used today

## 2018-07-24 NOTE — Telephone Encounter (Signed)
Bethany: Please fill out a form for Michele [name] to initiate botox for migraines  Danielle: I would like to schedule her for botox in 8-10 weeks thank you.

## 2018-07-24 NOTE — Progress Notes (Addendum)
[facility]    Provider:  Dr Lucia GaskinsAhern Requesting Provider: Rodrigo RanPerini, Mark, MD Primary Care Provider:  Rodrigo RanPerini, Mark, MD  CC:  Intractable migraines  Addendum 08/27/2018: Patient continues to suffer with uncharacteristic severe headaches and migraines which have been ongoing for months.  Despite adequate treatment, nerve blocks, increasing atenolol, migraine infusions, CGRP she continues to worsen she is having chronic daily headaches now, her vision is worsening becoming more blurry with diplopia, the headaches are becoming progressively more positional and exertional and occipital.  Patient needs an MRI of the brain with and without contrast as soon as possible.  Interval history 07/24/2018: Patient is being seen today in the office due to intractable severe headache and status migrainosus.  This is a very nice 63 year old patient with years of migraines (see below for history) and she is failed a tremendous amount of medications most first and second line migraine preventatives as well as many acute management medications such as triptans (for a full list see below).  She appears very uncomfortable today, she says her migraine is 10 out of 10.  Today we discussed multiple options, first was ways to break her migraine.  We also discussed acute management of her migraines in the future, and preventative medication.  Botox is the most appropriate way to proceed.  She has failed to CGRP medication and is currently not on them.  For more details on her migraines see history of present illness below.  Virtual Visit via Video Note 07/20/2018  I connected with@ on 07/24/18 at  3:30 PM EDT by a video enabled telemedicine application and verified that I am speaking with the correct person using two identifiers. Patient is at her work office and physician is in the office as well.    I discussed the limitations of evaluation and management by telemedicine and the availability of in person  appointments. The patient expressed understanding and agreed to proceed.  Anson FretAhern, Lexandra Rettke B, MD  HPI:  Michele [name] is a 63 y.o. female here as requested by Rodrigo RanPerini, Mark, MD for migraines. Patient has had migraines for years and has tried many medications. Currently, she has had a 9-week migraine that waxes and wanes but is daily. She has missed work seeing patients 3 times She has been in status migrainosus in the past but  this is the first time she has had a migraine this long and intractable. The last time she had imaging was in 2018 and normal. The migraine quality can be different, she does not have auras and she has tried all the triptans. Migraines are unilateral through the eye, pulsating and pounding, photo/phonophobia, movement makes it worse. Light is a trigger. Other times they can be all over the head. +nausea often, when she does get nausea she is incapacitated 3x in the last 9 weeks. Migraines are severe or moderately severe.  Atenolol helps.  She has 18 headache days a month on average and 10 migraine days a month for longer than one year. Over the last 9 weeks daily migraines. She tried aimovig for 3 months last fall. She tried Emgality 2 doses and then the headache started for 9 weeks. She is not on CGRP at this time. She tried baclofen and compazine but could not tolerate it. Compazine made her stomach feel worse. Toradol makes her sick. She is not tolerant of any of the triptans. No medication overuse. Migraines can last a minimum of 24 hours.  No other focal neurologic deficits, associated symptoms,  inciting events or modifiable factors.   Meds tried (this is not a complete list) : Zofran, tizanidine, atenolol, celexa, topiramate, propranolol, lopressor, phenergan, flexeril, lamictal, cymbalta, effexor, prozac, viibryd  Reviewed notes, labs and imaging from outside physicians, which showed:  She also takes Zofran 4mg  for nausea Tizanidine 2mg  for aches and pains and is  helpful Atenolol 50mg  Celexa 40mg  QD  Current and past medications ANALGESICS:demerol,tylenol,ultram,vicodin ANTI-MIGRAINE: axert, cafergot, DHE-45, imitrex tablet/injection, maxalt, midrin. She tells me she does not tolerate triptans well.  HEART/BP: lopressor, atenolol (very helpful) DECONGESTANT/ANTIHISTAMINE: allegra, clarinex, zyrtec ANTI-NAUSEANT: phenergan, vistaril, zofran NSAIDS: ibuprofen, naproxen, mobic, toradol MUSCLE RELAXANTS: amrix, flexeril, tizanidine ANTI-CONVULSANTS: topamax (expressive aphasia), Klonopin, Lamictal STEROIDS: medrol SLEEPING PILLS/TRANQUILIZERS:ambien, melatonin, xanax  ANTI-DEPRESSANTS: celexa, cymbalta, effexor (increased anxiety) pristiq, prozac, viibryd HERBAL: magnesium citrate (GI upset) FIBROMYALGIA:  HORMONAL: OTHER:  PROCEDURES FOR HEADACHES:   Review of Systems: Patient complains of symptoms per HPI as well as the following symptoms: headaches, nausea. Pertinent negatives and positives per HPI. All others negative.   Social History   Socioeconomic History   Marital status: Married    Spouse name: Not on file   Number of children: 2   Years of education: Not on file   Highest education level: Master's degree (e.g., MA, MS, MEng, MEd, MSW, MBA)  Occupational History   Not on file  Social Needs   Financial resource strain: Not on file   Food insecurity:    Worry: Not on file    Inability: Not on file   Transportation needs:    Medical: Not on file    Non-medical: Not on file  Tobacco Use   Smoking status: Never Smoker   Smokeless tobacco: Never Used  Substance and Sexual Activity   Alcohol use: Never    Frequency: Never   Drug use: Never   Sexual activity: Not on file  Lifestyle   Physical activity:    Days per week: Not on file    Minutes per session: Not on file   Stress: Not on file  Relationships   Social connections:    Talks on phone: Not on file    Gets together: Not on file    Attends  religious service: Not on file    Active member of club or organization: Not on file    Attends meetings of clubs or organizations: Not on file    Relationship status: Not on file   Intimate partner violence:    Fear of current or ex partner: Not on file    Emotionally abused: Not on file    Physically abused: Not on file    Forced sexual activity: Not on file  Other Topics Concern   Not on file  Social History Narrative   Lives at home with spouse   Right handed   Caffeine: 2 cups daily     Family History  Problem Relation Age of Onset   Lung cancer Father    Headache Father    Other Sister        ureter tumor, smoking related   Allergic Disorder Brother    Anxiety disorder Brother     Past Medical History:  Diagnosis Date   Anxiety    Chronic headaches    Dyspareunia in female    Greater trochanteric bursitis    bilateral   Hypersomnia    Migraines    Vitamin D deficiency    in the past per pt    Patient Active Problem List  Diagnosis Date Noted   Chronic migraine without aura, with intractable migraine, so stated, with status migrainosus 07/24/2018    Past Surgical History:  Procedure Laterality Date   COLONOSCOPY     endoscopy     LAPAROSCOPIC TOTAL HYSTERECTOMY     WISDOM TOOTH EXTRACTION      Current Outpatient Medications  Medication Sig Dispense Refill   ALPRAZolam (XANAX) 1 MG tablet Take 0.5 mg by mouth as needed for anxiety.     Armodafinil 250 MG tablet Take 125 mg by mouth 2 (two) times daily.     atenolol (TENORMIN) 100 MG tablet Take 1 tablet (100 mg total) by mouth daily. 90 tablet 11   citalopram (CELEXA) 40 MG tablet Take 40 mg by mouth at bedtime.     estradiol (VIVELLE-DOT) 0.05 MG/24HR patch Place 1 patch onto the skin 2 (two) times a week.     methylPREDNISolone (MEDROL DOSEPAK) 4 MG TBPK tablet Take pills together each morning with food for 6 days 21 tablet 1   ondansetron (ZOFRAN) 4 MG tablet Take 4 mg by  mouth as needed for nausea or vomiting.     traMADol (ULTRAM) 50 MG tablet Take 50 mg by mouth as needed (pain).     Ubrogepant (UBRELVY) 50 MG TABS Take 100 mg by mouth every 2 (two) hours as needed. Max  a day 20 tablet 6   venlafaxine XR (EFFEXOR XR) 75 MG 24 hr capsule Take 1 capsule (75 mg total) by mouth daily with breakfast. 90 capsule 4   No current facility-administered medications for this visit.     Allergies as of 07/24/2018   (No Known Allergies)    Vitals: BP 118/71 (BP Location: Left Arm, Patient Position: Sitting)    Pulse 69    Temp 99.3 F (37.4 C) (Oral) Comment: pt denies having any respiratory symptoms at all Last Weight:  Wt Readings from Last 1 Encounters:  07/20/18 125 lb (56.7 kg)   Last Height:   Ht Readings from Last 1 Encounters:  07/20/18  (1.676 m)   Physical exam: Exam: Gen: slight distress today, having a migraine        CV:  No peripheral edema, warm, nontender Eyes: Conjunctivae clear without exudates or hemorrhage  Neuro: Detailed Neurologic Exam  Speech:    Speech is normal; fluent and spontaneous with normal comprehension.  Cognition:    The patient is oriented to person, place, and time;     recent and remote memory intact;     language fluent;     normal attention, concentration,     fund of knowledge Cranial Nerves:    The pupils are equal, round, and reactive to light. Visual fields are full to finger confrontation. Extraocular movements are intact. Trigeminal sensation is intact and the muscles of mastication are normal. The face is symmetric. The palate elevates in the midline. Hearing intact. Voice is normal. Shoulder shrug is normal. The tongue has normal motion without fasciculations.   Coordination:    No dysmetria or ataxia noted  Gait:    Normal native gait  Motor Observation:    No asymmetry, no atrophy, and no involuntary movements noted. Tone:    Normal muscle tone.    Posture:    Posture is  normal. normal erect    Strength:    Strength is V/V in the upper and lower limbs.      Sensation: intact to LT       Assessment/Plan:  62 year  old patient with intractable chronic migraines without aura. FAILED CGRP(Aimovig, Emgality) and is no long on these meds.  Addendum 08/27/2018: Patient's headaches are worseningi in severity and frequency, new vision changes, daily, intractable, with very concerning characteristics of positional quality, worse bending over, morning headaches, worsening vision and diplopia, frontal and occipital in nature patient needs an MRI of the brain.MRI brain due to concerning symptoms of morning headaches, positional headaches,vision changes  to look for space occupying mass, chiari or intracranial hypertension (pseudotumor) or other etiology such as SAH,  Reversible cerebral vasoconstriction syndrome (RCVS).    Meds tried (this is not a complete list) : Zofran, tizanidine, atenolol, celexa, topiramate, propranolol, lopressor, phenergan, flexeril, lamictal, cymbalta, effexor, prozac, viibryd   - Patient is an excellent candidate for botox, we will request authorization. - FAILED CGRP, not on them (can consider Ajovy* in the future) - Does not tolerate triptans. Prescribe Bernita Raisin - Increase Atenolol to 100mg  - change celexa to effexor. - Physical Therapy: DRY NEEDLING for myofascial pain syndrome contributing to neck pain, cervicalgia and migraines  Follow Up Instructions:    I discussed the assessment and treatment plan with the patient. The patient was provided an opportunity to ask questions and all were answered. The patient agreed with the plan and demonstrated an understanding of the instructions.   The patient was advised to call back or seek an in-person evaluation if the symptoms worsen or if the condition fails to improve as anticipated.   Cc: Rodrigo Ran, MD   Discussed: To prevent or relieve headaches, try the following: Cool Compress.  Lie down and place a cool compress on your head.  Avoid headache triggers. If certain foods or odors seem to have triggered your migraines in the past, avoid them. A headache diary might help you identify triggers.  Include physical activity in your daily routine. Try a daily walk or other moderate aerobic exercise.  Manage stress. Find healthy ways to cope with the stressors, such as delegating tasks on your to-do list.  Practice relaxation techniques. Try deep breathing, yoga, massage and visualization.  Eat regularly. Eating regularly scheduled meals and maintaining a healthy diet might help prevent headaches. Also, drink plenty of fluids.  Follow a regular sleep schedule. Sleep deprivation might contribute to headaches Consider biofeedback. With this mind-body technique, you learn to control certain bodily functions -- such as muscle tension, heart rate and blood pressure -- to prevent headaches or reduce headache pain.    Proceed to emergency room if you experience new or worsening symptoms or symptoms do not resolve, if you have new neurologic symptoms or if headache is severe, or for any concerning symptom.   Provided education and documentation from American headache Society toolbox including articles on: chronic migraine medication overuse headache, chronic migraines, prevention of migraines, behavioral and other nonpharmacologic treatments for headache.   A total of 90 minutes was spent face-to-face with this patient. Over half this time was spent on counseling patient on the  1. Chronic migraine without aura, with intractable migraine, so stated, with status migrainosus    diagnosis and different diagnostic and therapeutic options, counseling and coordination of care, risks ans benefits of management, compliance, or risk factor reduction and education.    All procedures a documented blood were medically necessary, reasonable and appropriate based on the patient's history, medical  diagnosis and physician opinion. Verbal informed consent was obtained from the patient, patient was informed of potential risk of procedure, including bruising, bleeding, hematoma formation, infection, muscle  weakness, muscle pain, numbness, transient hypertension, transient hyperglycemia and transient insomnia among others. All areas injected were topically clean with isopropyl rubbing alcohol. Nonsterile nonlatex gloves were worn during the procedure.  1. Greater occipital nerve block [phone]). The greater occipital nerve site was identified at the nuchal line medial to the occipital artery. Medication was injected into the left and right occipital nerve areas and suboccipital areas. Patient's condition is associated with inflammation of the greater occipital nerve and associated multiple groups. Injection was deemed medically necessary, reasonable and appropriate. Injection represents a separate and unique surgical service.  2. Lesser occipital nerve block [phone]). The lesser occipital nerve site was identified approximately 2 cm lateral to the greater occipital nerve. Occasion was injected into the left and right occipital nerve areas. Patient's condition is associated with inflammation of the lesser occipital nerve and associated muscle groups. Injection was deemed medically necessary, reasonable and appropriate. Injection represents a separate and unique surgical service.   3. Auriculotemporal nerve block ([id number]): The Auriculotemporal nerve site was identified along the posterior margin of the sternocleidomastoid muscle toward the base of the ear. Medication was injected into the left and right radicular temporal nerve areas. Patient's condition is associated with inflammation of the Auriculotemporal Nerve and associated muscle groups. Injection was deemed medically necessary, reasonable and appropriate. Injection represents a separate and unique surgical service.     Consent Form Botulism Toxin  Injection For Chronic Migraine    Reviewed orally with patient, additionally signature is on file:  Botulism toxin has been approved by the Federal drug administration for treatment of chronic migraine. Botulism toxin does not cure chronic migraine and it may not be effective in some patients.  The administration of botulism toxin is accomplished by injecting a small amount of toxin into the muscles of the neck and head. Dosage must be titrated for each individual. Any benefits resulting from botulism toxin tend to wear off after 3 months with a repeat injection required if benefit is to be maintained. Injections are usually done every 3-4 months with maximum effect peak achieved by about 2 or 3 weeks. Botulism toxin is expensive and you should be sure of what costs you will incur resulting from the injection.  The side effects of botulism toxin use for chronic migraine may include:   -Transient, and usually mild, facial weakness with facial injections  -Transient, and usually mild, head or neck weakness with head/neck injections  -Reduction or loss of forehead facial animation due to forehead muscle weakness  -Eyelid drooping  -Dry eye  -Pain at the site of injection or bruising at the site of injection  -Double vision  -Potential unknown long term risks  Contraindications: You should not have Botox if you are pregnant, nursing, allergic to albumin, have an infection, skin condition, or muscle weakness at the site of the injection, or have myasthenia gravis, Lambert-Eaton syndrome, or ALS.  It is also possible that as with any injection, there may be an allergic reaction or no effect from the medication. Reduced effectiveness after repeated injections is sometimes seen and rarely infection at the injection site may occur. All care will be taken to prevent these side effects. If therapy is given over a long time, atrophy and wasting in the muscle injected may occur. Occasionally the patient's  become refractory to treatment because they develop antibodies to the toxin. In this event, therapy needs to be modified.  I have read the above information and consent to the administration of botulism toxin.  BOTOX PROCEDURE NOTE FOR MIGRAINE HEADACHE    Contraindications and precautions discussed with patient(above). Aseptic procedure was observed and patient tolerated procedure. Procedure performed by Dr. Artemio Aly  The condition has existed for more than 6 months, and pt does not have a diagnosis of ALS, Myasthenia Gravis or Lambert-Eaton Syndrome.  Risks and benefits of injections discussed and pt agrees to proceed with the procedure.  Written consent obtained  These injections are medically necessary. Pt  receives good benefits from these injections. These injections do not cause sedations or hallucinations which the oral therapies may cause.  Description of procedure:  The patient was placed in a sitting position. The standard protocol was used for Botox as follows, with 5 units of Botox injected at each site:   -Procerus muscle, midline injection  -Corrugator muscle, bilateral injection  -Frontalis muscle, bilateral injection, with 2 sites each side, medial injection was performed in the upper one third of the frontalis muscle, in the region vertical from the medial inferior edge of the superior orbital rim. The lateral injection was again in the upper one third of the forehead vertically above the lateral limbus of the cornea, 1.5 cm lateral to the medial injection site.  -Temporalis muscle injection, 5 sites, bilaterally. The first injection was 3 cm above the tragus of the ear, second injection site was 1.5 cm to 3 cm up from the first injection site in line with the tragus of the ear. The third injection site was 1.5-3 cm forward between the first 2 injection sites. The fourth injection site was 1.5 cm posterior to the second injection site. 5th site laterally in the  temporalis  muscleat the level of the outer canthus.  - Patient feels her clenching is a trigger for headaches. +5 units masseter bilaterally   -Occipitalis muscle injection, 3 sites, bilaterally. The first injection was done one half way between the occipital protuberance and the tip of the mastoid process behind the ear. The second injection site was done lateral and superior to the first, 1 fingerbreadth from the first injection. The third injection site was 1 fingerbreadth superiorly and medially from the first injection site.  -Cervical paraspinal muscle injection, 2 sites, bilateral knee first injection site was 1 cm from the midline of the cervical spine, 3 cm inferior to the lower border of the occipital protuberance. The second injection site was 1.5 cm superiorly and laterally to the first injection site.  -Trapezius muscle injection was performed at 3 sites, bilaterally. The first injection site was in the upper trapezius muscle halfway between the inflection point of the neck, and the acromion. The second injection site was one half way between the acromion and the first injection site. The third injection was done between the first injection site and the inflection point of the neck.   Will return for repeat injection in 3 months.   A 200 unit sof Botox was used, any Botox not injected was wasted. The patient tolerated the procedure well, there were no complications of the above procedure.     Naomie Dean, MD  [facility] [address] [place], [place] [address]  Phone [phone] Fax [phone]

## 2018-07-25 NOTE — Telephone Encounter (Signed)
I called to schedule the patient but she did not answer so I left a VM asking her to call me back. DW  °

## 2018-07-25 NOTE — Telephone Encounter (Signed)
Start form completed, ready for MD signature. Dx: W23.762

## 2018-07-25 NOTE — Addendum Note (Signed)
Addended by: Naomie Dean B on: 07/25/2018 06:21 PM   Modules accepted: Orders

## 2018-07-27 ENCOUNTER — Telehealth: Payer: Self-pay | Admitting: *Deleted

## 2018-07-27 NOTE — Telephone Encounter (Signed)
Request Reference Number: [id number]. UBRELVY TAB 50MG  is approved through 07/27/2019. For further questions, call [id number].   Called [facility] on [address] and advised pharmacy staff member of approval. Also informed him that pt should have a savings card for $1 per pill. He verbalized appreciation for the call.

## 2018-07-27 NOTE — Telephone Encounter (Signed)
Completed Ubrelvy 50 mg PA on Cover My Meds. KEY: [id number]. Cannot tolerate triptans. Urgent review requested. Awaiting determination from [facility]. Copay card will work even if denied by insurance.

## 2018-08-10 ENCOUNTER — Other Ambulatory Visit: Payer: Self-pay

## 2018-08-10 ENCOUNTER — Ambulatory Visit: Payer: 59 | Attending: Neurology | Admitting: Physical Therapy

## 2018-08-10 ENCOUNTER — Encounter: Payer: Self-pay | Admitting: Physical Therapy

## 2018-08-10 DIAGNOSIS — R252 Cramp and spasm: Secondary | ICD-10-CM | POA: Diagnosis not present

## 2018-08-10 DIAGNOSIS — M542 Cervicalgia: Secondary | ICD-10-CM

## 2018-08-10 NOTE — Therapy (Signed)
[facility] [address] [place], [place], [address] Phone: [phone]   Fax:  [phone]  Physical Therapy Evaluation  Patient Details  Name: Michele [name] MRN: [id number] Date of Birth: 04-21-1955 Referring Provider (PT): Anson Fret, MD   Encounter Date: 08/10/2018  PT End of Session - 08/10/18 1701    Visit Number  1    Number of Visits  23    Date for PT Re-Evaluation  09/21/18    Authorization Type  [employer]    Authorization - Visit Number  1    Authorization - Number of Visits  23    PT Start Time  1555    PT Stop Time  1650    PT Time Calculation (min)  55 min    Activity Tolerance  Patient tolerated treatment well    Behavior During Therapy  [facility] for tasks assessed/performed       Past Medical History:  Diagnosis Date  . Anxiety   . Chronic headaches   . Dyspareunia in female   . Greater trochanteric bursitis    bilateral  . Hypersomnia   . Migraines   . Vitamin D deficiency    in the past per pt    Past Surgical History:  Procedure Laterality Date  . COLONOSCOPY    . endoscopy    . LAPAROSCOPIC TOTAL HYSTERECTOMY    . WISDOM TOOTH EXTRACTION      There were no vitals filed for this visit.   Subjective Assessment - 08/10/18 1554    Subjective  Pt has a long history of migraines starting in her 20s.  Last fall she starting having migraines lasting 6-9 weeks.  Pt has tried many medications which have not helped or have had many side effects.  Pt recently finished a Medrol dose pack, and received trigger point injections/Botox/occipital nerve block.  Pt felt weak but some improvement in migraines since these interventions.  Referring MD suggested PT for dry needling.    Limitations  Other (comment)   does everything with pain when has a migraine, if nausea present can't sit up   Patient Stated Goals  learn about/try dry needling, alleviate headaches/mirgraines if PT can help    Currently in  Pain?  Yes    Pain Score  2    can get up to a 10   Pain Location  Head   head, eye (left)   Pain Orientation  Left;Right;Posterior;Lateral    Pain Descriptors / Indicators  Squeezing    Pain Type  Chronic pain    Pain Onset  More than a month ago    Pain Frequency  Constant   varies in intensity   Aggravating Factors   light    Pain Relieving Factors  none, maybe lay down, used to be able to use meds for relief but doesn't work lately    Effect of Pain on Daily Activities  does what she needs to but with migraines, misses work only if nausea hits         [name] PT Assessment - 08/10/18 0001      Assessment   Medical Diagnosis  G43.711 (ICD-10-CM) - Chronic migraine without aura, with intractable migraine, so stated, with status migrainosus M79.18 (ICD-10-CM) - Cervical myofascial pain syndrome M54.2 (ICD-10-CM) - Cervicalgia    Referring Provider (PT)  Anson Fret, MD    Onset Date/Surgical Date  --   40+ years ago   Hand Dominance  Right  Next MD Visit  no    Prior Therapy  no      Precautions   Precautions  None      Restrictions   Weight Bearing Restrictions  No      Balance Screen   Has the patient fallen in the past 6 months  No      Home Environment   Living Environment  Private residence      Prior Function   Level of Independence  Independent    Vocation  Full time employment    Futures trader, owns business    Leisure  plant flowers, walk, play with dog      Cognition   Overall Cognitive Status  Within Functional Limits for tasks assessed      Observation/Other Assessments   Observations  atrophy present bil supraspinatus, Rt scapula depressed/downwardly rotated, mild scapulae winging present    Focus on Therapeutic Outcomes (FOTO)   31%   goal 29%     Sensation   Light Touch  Appears Intact      Posture/Postural Control   Posture/Postural Control  Postural limitations    Postural Limitations  Increased  thoracic kyphosis    Posture Comments  --      ROM / Strength   AROM / PROM / Strength  AROM;Strength      AROM   AROM Assessment Site  Cervical    Cervical Flexion  60    Cervical Extension  65    Cervical - Right Side Bend  40    Cervical - Left Side Bend  30   pain on Right   Cervical - Right Rotation  65    Cervical - Left Rotation  60      Strength   Overall Strength Comments  5/5 cervical and throughout bil UEs with exception of shoulder abduction 4/5 bil      Palpation   Spinal mobility  signif limitation in thoracic PAs and rib sprining Rt>Lt    Palpation comment  tender to palpation: Rt upper trap, intrascapular region Rt>Lt      Special Tests    Special Tests  Cervical    Cervical Tests  Dictraction      Distraction Test   Findngs  Positive    Comment  for relief                Objective measurements completed on examination: See above findings.        Trigger Point Dry Needling - 08/10/18 0001    Consent Given?  Yes    Education Handout Provided  Yes    Muscles Treated Head and Neck  Upper trapezius    Dry Needling Comments  Right    Upper Trapezius Response  Twitch reponse elicited;Palpable increased muscle length           PT Education - 08/10/18 1655    Education Details  Access Code: [facility]     Person(s) Educated  Patient    Methods  Explanation;Demonstration;Verbal cues;Handout    Comprehension  Verbalized understanding;Returned demonstration       PT Short Term Goals - 08/10/18 1756      PT SHORT TERM GOAL #1   Title  Pt will be independent in intial HEP to include stretching, stabilization, ROM and postural re-ed exercises.    Time  3    Period  Weeks    Status  New    Target Date  08/31/18  PT SHORT TERM GOAL #2   Title  Pt will report overall reduction in pain and headaches by 20%.    Time  4    Period  Weeks    Status  New    Target Date  09/07/18      PT SHORT TERM GOAL #3   Title  Pt will report  adjustments to work station to allow proper posture at work to reduce postural pain.    Time  4    Period  Weeks    Status  New    Target Date  09/07/18        PT Long Term Goals - 08/10/18 1800      PT LONG TERM GOAL #1   Title  Pt will be ind in advanced HEP to address postural strength and mobility.    Time  6    Period  Weeks    Status  New    Target Date  09/21/18      PT LONG TERM GOAL #2   Title  Pt will achieve cervical rotation of at least 70 degrees to improve tasks such as driving.    Time  6    Period  Weeks    Status  New    Target Date  09/21/18      PT LONG TERM GOAL #3   Title  Pt will reduce FOTO score to </= 29%    Time  6    Period  Weeks    Status  New    Target Date  09/21/18      PT LONG TERM GOAL #4   Title  Pt will achieve shoulder abduction and scapular strength to at least 4+/5 to improve posture and functional use of bil UEs during daily tasks.    Time  6    Period  Weeks    Status  New    Target Date  09/21/18      PT LONG TERM GOAL #5   Title  Pt will report reduction of migraine occurrence by at least 50% to improve quality of life while managing her busy lifestyle.    Time  6    Period  Weeks    Status  New    Target Date  09/21/18             Plan - 08/10/18 1737    Clinical Impression Statement  Pt is a 62yo female with long history of migraines, some of which have lasted 6-9 weeks at a time since last Fall.  She works full time as a Contractorpsychiatric nurse practitioner and is able to push through symtpoms unless naseau accompanies migraine.  Recent interventions include Botox injections, trigger point injections and occipital nerve block.  Some relief has been noted but Pt reported feeling weak in neck afterwards.  Pt presents with significant tenderness and myofasical tension and trigger points in Rt>Lt upper trap and intrascapular region, end range cervical limitation in A/ROM in SB and Rot bil, and weakness in shoulder abduction  and scapular stabilizers bil.  Atrophy of supraspinatus is present Rt>Lt and as well as deep multifidus on Rt C5/6, C6/7 segments.  PT performed dry needling to Rt upper trap today with good release and relief reported by Pt.  Pt will benefit from skilled PT for manual therapy, traction, dry needling, soft tissue and joint mobilization, stabilization of upper quadrants and neck, ther ex and HEP to see if normalizing strength, flexibility, mobility and posture  improves her ongoing migraines.    Personal Factors and Comorbidities  Time since onset of injury/illness/exacerbation    Examination-Activity Limitations  Lift    Stability/Clinical Decision Making  Stable/Uncomplicated    Clinical Decision Making  Low    Rehab Potential  Good    PT Frequency  2x / week    PT Duration  6 weeks    PT Treatment/Interventions  ADLs/Self Care Home Management;Cryotherapy;Moist Heat;Iontophoresis 4mg /ml Dexamethasone;Traction;Electrical Stimulation;Therapeutic activities;Therapeutic exercise;Neuromuscular re-education;Patient/family education;Manual techniques;Passive range of motion;Dry needling;Taping;Joint Manipulations;Spinal Manipulations    PT Next Visit Plan  f/u on DN 1 (right upper trap), DN Rt intrascapular region/multifidi upper thoracic, lower cervical, try foam roller, postural re-ed, update HEP to progress stabilization    PT Home Exercise Plan  Access Code: [email or web address]    Consulted and Agree with Plan of Care  Patient       Patient will benefit from skilled therapeutic intervention in order to improve the following deficits and impairments:  Decreased range of motion, Increased fascial restricitons, Increased muscle spasms, Decreased activity tolerance, Pain, Hypomobility, Impaired flexibility, Improper body mechanics, Decreased mobility, Decreased strength, Postural dysfunction  Visit Diagnosis: Cervicalgia - Plan: PT plan of care cert/re-cert  Cramp and spasm - Plan: PT plan of care  cert/re-cert     Problem List Patient Active Problem List   Diagnosis Date Noted  . Chronic migraine without aura, with intractable migraine, so stated, with status migrainosus 07/24/2018    Morton Peters, PT 08/10/18 6:08 PM   [facility] [address] [place], [place], [address] Phone: [phone]   Fax:  [phone]  Name: TYCEE BLAMER MRN: [id number] Date of Birth: Jun 14, 1955

## 2018-08-10 NOTE — Patient Instructions (Signed)
Access Code: [facility]  URL: [email or web address]  Date: 08/10/2018  Prepared by: Loistine Simas Beuhring   Exercises  Supine Chin Tuck - 10 reps - 2 sets - 10 hold - 1x daily - 7x weekly  Seated Upper Trapezius Stretch - 3 reps - 2 sets - 20 hold - 1x daily - 7x weekly  Patient Education  Trigger Point Dry Needling

## 2018-08-16 ENCOUNTER — Telehealth: Payer: Self-pay | Admitting: Neurology

## 2018-08-16 DIAGNOSIS — G43711 Chronic migraine without aura, intractable, with status migrainosus: Secondary | ICD-10-CM | POA: Diagnosis not present

## 2018-08-16 NOTE — Telephone Encounter (Signed)
Patient called in stating that she feels awful with a migraine she is having nausea. She doesn't know if she should come in to have an infusion. The patient would like a call back on her cell phone at [phone].

## 2018-08-16 NOTE — Telephone Encounter (Signed)
Spoke with Dr. Lucia Gaskins. We can do a Depacon 1000 mg infusion if IV infusion will be able to do one today.

## 2018-08-16 NOTE — Telephone Encounter (Signed)
Spoke with infusion RN Leanne. They are able to infuse at 3 pm.   Order written for Depacon 1 gram IV x 1 per v.o. Dr. Lucia Gaskins. Spoke with pt and advised of plan. She was in agreement and verbalized appreciation. Confirmed pt to be here at 3 pm today for infusion. Confirmed pt has no allergies. Also pt confirmed no known exposure to COVID19 + people, no travel, no fever, no symptoms.   Order signed by Dr. Lucia Gaskins & given to [facility] RN in infusion.

## 2018-08-17 NOTE — Telephone Encounter (Signed)
I called the patient x2 to schedule injection. She did not answer so I left a VM asking her to call us back.

## 2018-08-18 ENCOUNTER — Ambulatory Visit: Payer: 59 | Admitting: Physical Therapy

## 2018-08-22 ENCOUNTER — Telehealth: Payer: Self-pay | Admitting: Neurology

## 2018-08-22 NOTE — Telephone Encounter (Signed)
Dr. Lucia Gaskins is messaging pt back.

## 2018-08-22 NOTE — Telephone Encounter (Signed)
Pt states she has left messages on Mychart and is really having a hard time with these headaches and would like a call or message back as soon as possible. Please advise.

## 2018-08-23 ENCOUNTER — Ambulatory Visit: Payer: 59 | Attending: Neurology

## 2018-08-23 ENCOUNTER — Other Ambulatory Visit: Payer: Self-pay

## 2018-08-23 DIAGNOSIS — R252 Cramp and spasm: Secondary | ICD-10-CM | POA: Diagnosis present

## 2018-08-23 DIAGNOSIS — M542 Cervicalgia: Secondary | ICD-10-CM | POA: Insufficient documentation

## 2018-08-23 NOTE — Therapy (Signed)
[facility] [address] [place], [place], [address] Phone: [phone]   Fax:  [phone]  Physical Therapy Treatment  Patient Details  Name: Michele [name] MRN: [id number] Date of Birth: 04/15/1956 Referring Provider (PT): Anson FretAhern, Antonia B, MD   Encounter Date: 08/23/2018  PT End of Session - 08/23/18 0845    Visit Number  2    Number of Visits  23    Date for PT Re-Evaluation  09/21/18    Authorization Type  [employer]    Authorization - Visit Number  2    Authorization - Number of Visits  23    PT Start Time  0759    PT Stop Time  0841    PT Time Calculation (min)  42 min    Activity Tolerance  Patient tolerated treatment well    Behavior During Therapy  [facility]/performed       Past Medical History:  Diagnosis Date  . Anxiety   . Chronic headaches   . Dyspareunia in female   . Greater trochanteric bursitis    bilateral  . Hypersomnia   . Migraines   . Vitamin D deficiency    in the past per pt    Past Surgical History:  Procedure Laterality Date  . COLONOSCOPY    . endoscopy    . LAPAROSCOPIC TOTAL HYSTERECTOMY    . WISDOM TOOTH EXTRACTION      There were no vitals filed for this visit.  Subjective Assessment - 08/23/18 0756    Subjective  Things are not good.  I contacted Dr Lucia GaskinsAhern and I need to make some changes.    (Pended)     Patient Stated Goals  learn about/try dry needling, alleviate headaches/mirgraines if PT can help  (Pended)     Currently in Pain?  Yes  (Pended)     Pain Score  3   (Pended)     Pain Location  Head  (Pended)     Pain Orientation  Right;Left  (Pended)     Pain Descriptors / Indicators  Squeezing  (Pended)     Pain Type  Chronic pain  (Pended)     Pain Onset  More than a month ago  (Pended)     Pain Frequency  Constant  (Pended)     Aggravating Factors   light  (Pended)     Pain Relieving Factors  nothing, it is constant.    (Pended)                         [facility] Adult PT Treatment/Exercise - 08/23/18 0001      Manual Therapy   Manual Therapy  Soft tissue mobilization;Joint mobilization    Joint Mobilization  PA mobs C3-6, T2-5    Soft tissue mobilization  soft tissue elongation and trigger point release, passive stretch to bil upper traps, suboccipitals and cervical paraspinals.       Trigger Point Dry Needling - 08/23/18 0001    Consent Given?  Yes    Education Handout Provided  Previously provided    Muscles Treated Head and Neck  Upper trapezius;Suboccipitals;Cervical multifidi;Oblique capitus    Other Dry Needling  thoracic multifidiT2-6    Upper Trapezius Response  Twitch reponse elicited;Palpable increased muscle length    Oblique Capitus Response  Twitch response elicited;Palpable increased muscle length    Cervical multifidi Response  Twitch reponse elicited;Palpable increased muscle length  PT Short Term Goals - 08/10/18 1756      PT SHORT TERM GOAL #1   Title  Pt will be independent in intial HEP to include stretching, stabilization, ROM and postural re-ed exercises.    Time  3    Period  Weeks    Status  New    Target Date  08/31/18      PT SHORT TERM GOAL #2   Title  Pt will report overall reduction in pain and headaches by 20%.    Time  4    Period  Weeks    Status  New    Target Date  09/07/18      PT SHORT TERM GOAL #3   Title  Pt will report adjustments to work station to allow proper posture at work to reduce postural pain.    Time  4    Period  Weeks    Status  New    Target Date  09/07/18        PT Long Term Goals - 08/10/18 1800      PT LONG TERM GOAL #1   Title  Pt will be ind in advanced HEP to address postural strength and mobility.    Time  6    Period  Weeks    Status  New    Target Date  09/21/18      PT LONG TERM GOAL #2   Title  Pt will achieve cervical rotation of at least 70 degrees to improve tasks such as driving.    Time  6     Period  Weeks    Status  New    Target Date  09/21/18      PT LONG TERM GOAL #3   Title  Pt will reduce FOTO score to </= 29%    Time  6    Period  Weeks    Status  New    Target Date  09/21/18      PT LONG TERM GOAL #4   Title  Pt will achieve shoulder abduction and scapular strength to at least 4+/5 to improve posture and functional use of bil UEs during daily tasks.    Time  6    Period  Weeks    Status  New    Target Date  09/21/18      PT LONG TERM GOAL #5   Title  Pt will report reduction of migraine occurrence by at least 50% to improve quality of life while managing her busy lifestyle.    Time  6    Period  Weeks    Status  New    Target Date  09/21/18            Plan - 08/23/18 0841    Clinical Impression Statement  Pt with continued migraine headache that is constant.  Session today focused on dry needling to neck and thoracic spine with manual therapy following to improve tissue mobility and release tension.  Pt with significant tension in bil upper traps, suboccipitals and thoracic/cervical multifidi.  Pt demonstrated improved tissue mobility after manual therapy today.  PT educated pt regarding taking frequent breaks during long stretches at the computer.  Pt will continue to benefit from skilled PT to improve tissue mobility in the cervical and thoracic spine and HEP for strength and flexibility.      Rehab Potential  Good    PT Frequency  2x / week    PT Duration  6  weeks    PT Treatment/Interventions  ADLs/Self Care Home Management;Cryotherapy;Moist Heat;Iontophoresis 4mg /ml Dexamethasone;Traction;Electrical Stimulation;Therapeutic activities;Therapeutic exercise;Neuromuscular re-education;Patient/family education;Manual techniques;Passive range of motion;Dry needling;Taping;Joint Manipulations;Spinal Manipulations    PT Next Visit Plan  assess response to dry needling, add postural strength exercises with theraband and update HEP, continue dry needling if  helpful    PT Home Exercise Plan  Access Code: [facility]    Recommended Other Services  initial certification is signed    Consulted and Agree with Plan of Care  Patient       Patient will benefit from skilled therapeutic intervention in order to improve the following deficits and impairments:  Decreased range of motion, Increased fascial restricitons, Increased muscle spasms, Decreased activity tolerance, Pain, Hypomobility, Impaired flexibility, Improper body mechanics, Decreased mobility, Decreased strength, Postural dysfunction  Visit Diagnosis: Cervicalgia  Cramp and spasm     Problem List Patient Active Problem List   Diagnosis Date Noted  . Chronic migraine without aura, with intractable migraine, so stated, with status migrainosus 07/24/2018    Lorrene Reid, PT 08/23/18 8:47 AM  [facility] [address], [address] [place], [place], [address] Phone: [phone]   Fax:  [phone]  Name: Michele [name] MRN: [id number] Date of Birth: [birth year]

## 2018-08-27 ENCOUNTER — Telehealth: Payer: Self-pay | Admitting: Neurology

## 2018-08-27 NOTE — Addendum Note (Signed)
Addended by: Naomie Dean B on: 08/27/2018 06:49 PM   Modules accepted: Orders

## 2018-08-27 NOTE — Telephone Encounter (Signed)
I spoke to patient. Patient's headaches are worseningi in severity and frequency, new vision changes, daily, intractable, with very concerning characteristics of positional quality, worse bending over, morning headaches, worsening vision and diplopia, frontal and occipital in nature patient needs an MRI of the brain.MRI brain due to concerning symptoms of morning headaches, positional headaches,vision changes  to look for space occupying mass, chiari or intracranial hypertension (pseudotumor) or other etiology such as SAH, Reversible cerebral vasoconstriction syndrome (RCVS).   Michele [name], I ordered the MRI stat, can we get her an MRI sometime this week? Let me know what needs to be done to get this completed. thanks

## 2018-08-28 ENCOUNTER — Ambulatory Visit: Payer: 59

## 2018-08-28 ENCOUNTER — Other Ambulatory Visit: Payer: Self-pay

## 2018-08-28 DIAGNOSIS — R252 Cramp and spasm: Secondary | ICD-10-CM

## 2018-08-28 DIAGNOSIS — M542 Cervicalgia: Secondary | ICD-10-CM | POA: Diagnosis not present

## 2018-08-28 NOTE — Telephone Encounter (Signed)
When I spoke to the patient to schedule her MRI she informed me she was claustrophobic but I forgot to ask her if she wanted something to help her.. I called her back and left her a voicemail to give [place] a call back if she would like Dr. Lucia Gaskins to order her something to help her get through the MRI. If she did want something she would need a driver because it can make her sleepy.

## 2018-08-28 NOTE — Therapy (Addendum)
[facility] [address], [place], [place], [address] Phone: [phone]   Fax:  [phone]  Physical Therapy Treatment  Patient Details  Name: Michele [name] MRN: [id number] Date of Birth: 02-13-1956 Referring Provider (PT): Melvenia Beam, MD   Encounter Date: 08/28/2018  PT End of Session - 08/28/18 0850    Visit Number  3    Date for PT Re-Evaluation  09/21/18    Authorization Type  [employer]    Authorization - Visit Number  3    Authorization - Number of Visits  23    PT Start Time  0802   dry needling   PT Stop Time  0834    PT Time Calculation (min)  32 min    Activity Tolerance  Patient tolerated treatment well    Behavior During Therapy  [facility] for tasks assessed/performed       Past Medical History:  Diagnosis Date  . Anxiety   . Chronic headaches   . Dyspareunia in female   . Greater trochanteric bursitis    bilateral  . Hypersomnia   . Migraines   . Vitamin D deficiency    in the past per pt    Past Surgical History:  Procedure Laterality Date  . COLONOSCOPY    . endoscopy    . LAPAROSCOPIC TOTAL HYSTERECTOMY    . WISDOM TOOTH EXTRACTION      There were no vitals filed for this visit.  Subjective Assessment - 08/28/18 0803    Subjective  No changes in headaches.  My muscles feel looser with dry needling.      Currently in Pain?  Yes    Pain Score  3     Pain Location  Head    Pain Orientation  Right;Left    Pain Descriptors / Indicators  Squeezing    Pain Type  Chronic pain    Pain Onset  More than a month ago    Pain Frequency  Constant    Aggravating Factors   light, constant    Pain Relieving Factors  nothing                       [facility] Adult PT Treatment/Exercise - 08/28/18 0001      Manual Therapy   Manual Therapy  Soft tissue mobilization;Joint mobilization    Joint Mobilization  PA mobs C3-6, T2-5    Soft tissue mobilization  soft tissue elongation and  trigger point release, passive stretch to bil upper traps, suboccipitals and cervical paraspinals.       Trigger Point Dry Needling - 08/28/18 0001    Consent Given?  Yes    Education Handout Provided  Previously provided    Muscles Treated Head and Neck  Upper trapezius;Suboccipitals;Cervical multifidi;Oblique capitus    Other Dry Needling  thoracic multifidiT2-6    Upper Trapezius Response  Twitch reponse elicited;Palpable increased muscle length    Oblique Capitus Response  Twitch response elicited;Palpable increased muscle length    Cervical multifidi Response  Twitch reponse elicited;Palpable increased muscle length           PT Education - 08/28/18 0848    Education Details   Access Code: [facility]     Person(s) Educated  Patient    Methods  Explanation;Demonstration;Handout    Comprehension  Verbalized understanding;Returned demonstration       PT Short Term Goals - 08/10/18 1756      PT SHORT TERM GOAL #  1   Title  Pt will be independent in intial HEP to include stretching, stabilization, ROM and postural re-ed exercises.    Time  3    Period  Weeks    Status  New    Target Date  08/31/18      PT SHORT TERM GOAL #2   Title  Pt will report overall reduction in pain and headaches by 20%.    Time  4    Period  Weeks    Status  New    Target Date  09/07/18      PT SHORT TERM GOAL #3   Title  Pt will report adjustments to work station to allow proper posture at work to reduce postural pain.    Time  4    Period  Weeks    Status  New    Target Date  09/07/18        PT Long Term Goals - 08/10/18 1800      PT LONG TERM GOAL #1   Title  Pt will be ind in advanced HEP to address postural strength and mobility.    Time  6    Period  Weeks    Status  New    Target Date  09/21/18      PT LONG TERM GOAL #2   Title  Pt will achieve cervical rotation of at least 70 degrees to improve tasks such as driving.    Time  6    Period  Weeks    Status  New    Target  Date  09/21/18      PT LONG TERM GOAL #3   Title  Pt will reduce FOTO score to </= 29%    Time  6    Period  Weeks    Status  New    Target Date  09/21/18      PT LONG TERM GOAL #4   Title  Pt will achieve shoulder abduction and scapular strength to at least 4+/5 to improve posture and functional use of bil UEs during daily tasks.    Time  6    Period  Weeks    Status  New    Target Date  09/21/18      PT LONG TERM GOAL #5   Title  Pt will report reduction of migraine occurrence by at least 50% to improve quality of life while managing her busy lifestyle.    Time  6    Period  Weeks    Status  New    Target Date  09/21/18              Patient will benefit from skilled therapeutic intervention in order to improve the following deficits and impairments:     Visit Diagnosis: Cervicalgia  Cramp and spasm     Problem List Patient Active Problem List   Diagnosis Date Noted  . Chronic migraine without aura, with intractable migraine, so stated, with status migrainosus 07/24/2018    Sigurd Sos, PT 08/28/18 8:51 AM PHYSICAL THERAPY DISCHARGE SUMMARY  Visits from Start of Care: 3  Current functional level related to goals / functional outcomes: See above for current PT status.  Pt didn't return to PT.     Remaining deficits: See above   Education / Equipment: HEP, pain management Plan: Patient agrees to discharge.  Patient goals were not met. Patient is being discharged due to not returning since the last visit.  ?????  Sigurd Sos, PT 10/23/18 12:17 PM  [facility] [address] [place], [place], [address] Phone: [phone]   Fax:  [phone]  Name: Michele [name] MRN: [id number] Date of Birth: 1955/07/03

## 2018-08-28 NOTE — Patient Instructions (Signed)
Access Code: [facility]  URL: [email or web address]  Date: 08/28/2018  Prepared by: Lorrene Reid   Exercises   Jaw Abduction - 10 reps - 3 sets - 1x daily - 7x weekly  Jaw Protraction - 10 reps - 3 sets - 1x daily - 7x weekly  Seated TMJ Opening with Unilateral Facilitation with Finger - 10 reps - 3 sets - 1x daily - 7x weekly  Patient Education  Trigger Point Dry Needling

## 2018-08-28 NOTE — Telephone Encounter (Signed)
Patient called in stating that she could not do 08/30/18 at [facility].  And rescheduled for 09/05/18

## 2018-08-28 NOTE — Telephone Encounter (Signed)
no to the covid-19 questions MR Brain w/wo contrast Dr. Lucia Gaskins [facility] Auth: [id number] (exp. 08/28/18 to 10/12/18). Patient is scheduled at [facility] for 08/30/18.

## 2018-08-30 ENCOUNTER — Ambulatory Visit: Payer: 59

## 2018-08-30 ENCOUNTER — Other Ambulatory Visit: Payer: 59

## 2018-08-30 DIAGNOSIS — Z6822 Body mass index (BMI) 22.0-22.9, adult: Secondary | ICD-10-CM | POA: Diagnosis not present

## 2018-08-30 DIAGNOSIS — Z01419 Encounter for gynecological examination (general) (routine) without abnormal findings: Secondary | ICD-10-CM | POA: Diagnosis not present

## 2018-09-05 ENCOUNTER — Other Ambulatory Visit: Payer: Self-pay

## 2018-09-05 ENCOUNTER — Ambulatory Visit: Payer: 59

## 2018-09-05 DIAGNOSIS — H538 Other visual disturbances: Secondary | ICD-10-CM

## 2018-09-05 DIAGNOSIS — G441 Vascular headache, not elsewhere classified: Secondary | ICD-10-CM | POA: Diagnosis not present

## 2018-09-05 DIAGNOSIS — R51 Headache with orthostatic component, not elsewhere classified: Secondary | ICD-10-CM

## 2018-09-05 DIAGNOSIS — H532 Diplopia: Secondary | ICD-10-CM | POA: Diagnosis not present

## 2018-09-05 DIAGNOSIS — G4484 Primary exertional headache: Secondary | ICD-10-CM

## 2018-09-05 DIAGNOSIS — R519 Headache, unspecified: Secondary | ICD-10-CM

## 2018-09-05 DIAGNOSIS — H5713 Ocular pain, bilateral: Secondary | ICD-10-CM

## 2018-09-05 MED ORDER — GADOBENATE DIMEGLUMINE 529 MG/ML IV SOLN
11.0000 mL | Freq: Once | INTRAVENOUS | Status: AC | PRN
  Administered 2018-09-05: 16:00:00 11 mL via INTRAVENOUS

## 2018-09-06 ENCOUNTER — Ambulatory Visit: Payer: 59

## 2018-10-23 NOTE — Telephone Encounter (Signed)
I called the patient to schedule but she did not answer so I left a VM asking her to call me back. DW  °

## 2018-10-24 NOTE — Telephone Encounter (Signed)
I called and scheduled the patient for a Botox injection. DW

## 2018-10-26 ENCOUNTER — Telehealth: Payer: Self-pay | Admitting: *Deleted

## 2018-10-26 ENCOUNTER — Ambulatory Visit: Payer: 59 | Admitting: Adult Health

## 2018-10-26 ENCOUNTER — Other Ambulatory Visit: Payer: Self-pay | Admitting: *Deleted

## 2018-10-26 ENCOUNTER — Other Ambulatory Visit: Payer: Self-pay

## 2018-10-26 VITALS — Temp 97.3°F

## 2018-10-26 DIAGNOSIS — G43711 Chronic migraine without aura, intractable, with status migrainosus: Secondary | ICD-10-CM | POA: Diagnosis not present

## 2018-10-26 MED ORDER — VENLAFAXINE HCL ER 75 MG PO CP24: 75.0000 mg | ORAL_CAPSULE | Freq: Two times a day (BID) | ORAL | 3 refills | Status: DC

## 2018-10-26 NOTE — Progress Notes (Signed)
Agree with procedure as documented. Dr Jaynee Eagles

## 2018-10-26 NOTE — Telephone Encounter (Signed)
The pt asked for an increase in her Effexor to 75 mg BID. I d/w Dr. Jaynee Eagles and obtained v.o. to increase Effexor XR. Order placed.

## 2018-10-26 NOTE — Progress Notes (Signed)
       BOTOX PROCEDURE NOTE FOR MIGRAINE HEADACHE    Contraindications and precautions discussed with patient(above). Aseptic procedure was observed and patient tolerated procedure. Procedure performed by Ward Givens, NP  The condition has existed for more than 6 months, and pt does not have a diagnosis of ALS, Myasthenia Gravis or Lambert-Eaton Syndrome.  Risks and benefits of injections discussed and pt agrees to proceed with the procedure.  Written consent obtained  These injections are medically necessary. These injections do not cause sedations or hallucinations which the oral therapies may cause.  Indication/Diagnosis: chronic migraine BOTOX(J0585) injection was performed according to protocol by [employer]. 200 units of BOTOX was dissolved into 4 cc NS.   NDC: [address]-46  Type of toxin: Botox  Botox- 200 units x 1 vial Lot: [id number] Expiration: 11/2020 NDC: 4132-4401-02  Bacteriostatic 0.9% Sodium Chloride- 58mL total Lot: [id number] Expiration: 01/18/2019 NDC: 4403-4742-59  Dx: D63.875 samples   Description of procedure:  The patient was placed in a sitting position. The standard protocol was used for Botox as follows, with 5 units of Botox injected at each site:   -Procerus muscle,- Pt refused  -Corrugator muscle- pt refused  -Frontalis muscle- pt refused  -Temporalis muscle injection, 4 sites, bilaterally. The first injection was 3 cm above the tragus of the ear, second injection site was 1.5 cm to 3 cm up from the first injection site in line with the tragus of the ear. The third injection site was 1.5-3 cm forward between the first 2 injection sites. The fourth injection site was 1.5 cm posterior to the second injection site.  -Occipitalis muscle injection, 3 sites, bilaterally. The first injection was done one half way between the occipital protuberance and the tip of the mastoid process behind the ear. The second injection site was done lateral and superior  to the first, 1 fingerbreadth from the first injection. The third injection site was 1 fingerbreadth superiorly and medially from the first injection site.  -Cervical paraspinal muscle injection, 2 sites, bilateral knee first injection site was 1 cm from the midline of the cervical spine, 3 cm inferior to the lower border of the occipital protuberance. The second injection site was 1.5 cm superiorly and laterally to the first injection site.  -Trapezius muscle injection was performed at 3 sites, bilaterally. The first injection site was in the upper trapezius muscle halfway between the inflection point of the neck, and the acromion. The second injection site was one half way between the acromion and the first injection site. The third injection was done between the first injection site and the inflection point of the neck.   Will return for repeat injection in 3 months.   A 200 unit sof Botox was used, 120 units were injected, the rest of the Botox was wasted. The patient tolerated the procedure well, there were no complications of the above procedure.  Ward Givens, MSN, NP-C 10/26/2018, 4:34 PM [facility] [address] [place], [place] [address] [phone]

## 2018-10-26 NOTE — Progress Notes (Signed)
Botox- 200 units x 1 vial Lot: [id number] Expiration: 11/2020 NDC: 5956-3875-64  Bacteriostatic 0.9% Sodium Chloride- 70mL total Lot: [id number] Expiration: 01/18/2019 NDC: 8841-6606-30  Dx: Z60.109 samples

## 2018-12-11 ENCOUNTER — Other Ambulatory Visit: Payer: Self-pay | Admitting: Neurology

## 2018-12-11 MED ORDER — BACLOFEN 10 MG PO TABS: 5.0000 mg | ORAL_TABLET | Freq: Three times a day (TID) | ORAL | 6 refills | Status: DC | PRN

## 2019-01-21 ENCOUNTER — Telehealth: Payer: Self-pay | Admitting: Neurology

## 2019-01-21 NOTE — Telephone Encounter (Signed)
Danielle, Can you call her to get her next botox scheduled? Thanks!  Romelle Starcher, [place])

## 2019-01-22 ENCOUNTER — Other Ambulatory Visit: Payer: Self-pay | Admitting: *Deleted

## 2019-01-22 MED ORDER — BOTOX 100 UNITS IJ SOLR: INTRAMUSCULAR | 1 refills | Status: DC

## 2019-01-22 NOTE — Telephone Encounter (Signed)
I called to schedule the patient but she did not answer, I left a VM asking her to call back. Bethany please send script for Botox to [facility]. DW

## 2019-01-22 NOTE — Telephone Encounter (Signed)
botox order sent to [place] ([facility] pharmacy.

## 2019-01-22 NOTE — Telephone Encounter (Signed)
Pt has called back in response to an appointment for her Botox.  Please call

## 2019-01-22 NOTE — Telephone Encounter (Signed)
Noted, thank you. DW  °

## 2019-01-22 NOTE — Telephone Encounter (Signed)
Sent a mychart message to patient. DW

## 2019-01-23 NOTE — Telephone Encounter (Signed)
I called to check status of the patients medication with [facility] and it is pending medical benefits investigation. They expedited the request and stated that it should be done today.

## 2019-01-24 NOTE — Telephone Encounter (Signed)
I called [facility] to check status of the medication. It is pending patient consent. DW

## 2019-01-30 ENCOUNTER — Other Ambulatory Visit: Payer: Self-pay

## 2019-01-30 ENCOUNTER — Ambulatory Visit: Payer: 59 | Admitting: Neurology

## 2019-01-30 ENCOUNTER — Ambulatory Visit: Payer: 59 | Attending: Internal Medicine

## 2019-01-30 VITALS — Temp 98.0°F

## 2019-01-30 DIAGNOSIS — M545 Low back pain: Secondary | ICD-10-CM | POA: Diagnosis present

## 2019-01-30 DIAGNOSIS — R252 Cramp and spasm: Secondary | ICD-10-CM | POA: Diagnosis not present

## 2019-01-30 DIAGNOSIS — G8929 Other chronic pain: Secondary | ICD-10-CM | POA: Insufficient documentation

## 2019-01-30 DIAGNOSIS — G43711 Chronic migraine without aura, intractable, with status migrainosus: Secondary | ICD-10-CM

## 2019-01-30 NOTE — Progress Notes (Signed)
Botox- 100 units x 2 vials Lot: [id number] Expiration: 09/2021 NDC: 7588-3254-98  Bacteriostatic 0.9% Sodium Chloride- 72mL total Lot: [id number] Expiration: 10/18/2019 NDC: 3094-0768-08  Dx: U11.031 S/P

## 2019-01-30 NOTE — Progress Notes (Signed)
Consent Form Botulism Toxin Injection For Chronic Migraine  She is significantly better, migraines >70% improved in frequency and severity. She is on Nurtec and likes it acutely.  Reviewed orally with patient, additionally signature is on file:  Botulism toxin has been approved by the Federal drug administration for treatment of chronic migraine. Botulism toxin does not cure chronic migraine and it may not be effective in some patients.  The administration of botulism toxin is accomplished by injecting a small amount of toxin into the muscles of the neck and head. Dosage must be titrated for each individual. Any benefits resulting from botulism toxin tend to wear off after 3 months with a repeat injection required if benefit is to be maintained. Injections are usually done every 3-4 months with maximum effect peak achieved by about 2 or 3 weeks. Botulism toxin is expensive and you should be sure of what costs you will incur resulting from the injection.  The side effects of botulism toxin use for chronic migraine may include:   -Transient, and usually mild, facial weakness with facial injections  -Transient, and usually mild, head or neck weakness with head/neck injections  -Reduction or loss of forehead facial animation due to forehead muscle weakness  -Eyelid drooping  -Dry eye  -Pain at the site of injection or bruising at the site of injection  -Double vision  -Potential unknown long term risks  Contraindications: You should not have Botox if you are pregnant, nursing, allergic to albumin, have an infection, skin condition, or muscle weakness at the site of the injection, or have myasthenia gravis, Lambert-Eaton syndrome, or ALS.  It is also possible that as with any injection, there may be an allergic reaction or no effect from the medication. Reduced effectiveness after repeated injections is sometimes seen and rarely infection at the injection site may occur. All care will be taken to  prevent these side effects. If therapy is given over a long time, atrophy and wasting in the muscle injected may occur. Occasionally the patient's become refractory to treatment because they develop antibodies to the toxin. In this event, therapy needs to be modified.  I have read the above information and consent to the administration of botulism toxin.    BOTOX PROCEDURE NOTE FOR MIGRAINE HEADACHE    Contraindications and precautions discussed with patient(above). Aseptic procedure was observed and patient tolerated procedure. Procedure performed by Dr. Georgia Dom  The condition has existed for more than 6 months, and pt does not have a diagnosis of ALS, Myasthenia Gravis or Lambert-Eaton Syndrome.  Risks and benefits of injections discussed and pt agrees to proceed with the procedure.  Written consent obtained  These injections are medically necessary. Pt  receives good benefits from these injections. These injections do not cause sedations or hallucinations which the oral therapies may cause.  Description of procedure:  The patient was placed in a sitting position. The standard protocol was used for Botox as follows, with 5 units of Botox injected at each site:   -Procerus muscle, midline injection  -Corrugator muscle, bilateral injection  -Frontalis muscle, bilateral injection, with 2 sites each side, medial injection was performed in the upper one third of the frontalis muscle, in the region vertical from the medial inferior edge of the superior orbital rim. The lateral injection was again in the upper one third of the forehead vertically above the lateral limbus of the cornea, 1.5 cm lateral to the medial injection site.  -Temporalis muscle injection, 4 sites, bilaterally. The first  injection was 3 cm above the tragus of the ear, second injection site was 1.5 cm to 3 cm up from the first injection site in line with the tragus of the ear. The third injection site was 1.5-3 cm forward  between the first 2 injection sites. The fourth injection site was 1.5 cm posterior to the second injection site.   -Occipitalis muscle injection, 3 sites, bilaterally. The first injection was done one half way between the occipital protuberance and the tip of the mastoid process behind the ear. The second injection site was done lateral and superior to the first, 1 fingerbreadth from the first injection. The third injection site was 1 fingerbreadth superiorly and medially from the first injection site.  -Cervical paraspinal muscle injection, 2 sites, bilateral knee first injection site was 1 cm from the midline of the cervical spine, 3 cm inferior to the lower border of the occipital protuberance. The second injection site was 1.5 cm superiorly and laterally to the first injection site.  -Trapezius muscle injection was performed at 3 sites, bilaterally. The first injection site was in the upper trapezius muscle halfway between the inflection point of the neck, and the acromion. The second injection site was one half way between the acromion and the first injection site. The third injection was done between the first injection site and the inflection point of the neck.   Will return for repeat injection in 3 months.   200 units of Botox was used, any Botox not injected was wasted. The patient tolerated the procedure well, there were no complications of the above procedure.

## 2019-01-30 NOTE — Patient Instructions (Signed)
Access Code: [email or web address]  URL: [email or web address]  Date: 01/30/2019  Prepared by: Sigurd Sos   Exercises Supine Lower Trunk Rotation - 3 reps - 1 sets - 20 hold - 1x daily - 7x weekly Hooklying Single Knee to Chest - 3 reps - 1 sets - 20 hold - 3x daily - 7x weekly Seated Hamstring Stretch - 3 reps - 1 sets - 20 hold - 3x daily - 7x weekly Seated Piriformis Stretch with Trunk Bend - 3 reps - 1 sets - 20 hold - 3x daily - 7x weekly

## 2019-01-30 NOTE — Therapy (Signed)
[facility] [address], [address] [place], [place], [address] Phone: [phone]   Fax:  [phone]  Physical Therapy Evaluation  Patient Details  Name: Michele [name] MRN: [id number] Date of Birth: 12/21/1955 Referring Provider (PT): Rodrigo RanPerini, Mark, MD   Encounter Date: 01/30/2019  PT End of Session - 01/30/19 1228    Visit Number  1    Date for PT Re-Evaluation  03/27/19    Authorization Type  UHC    PT Start Time  1143    PT Stop Time  1227    PT Time Calculation (min)  44 min    Activity Tolerance  Patient tolerated treatment well    Behavior During Therapy  [facility] for tasks assessed/performed       Past Medical History:  Diagnosis Date  . Anxiety   . Chronic headaches   . Dyspareunia in female   . Greater trochanteric bursitis    bilateral  . Hypersomnia   . Migraines   . Vitamin D deficiency    in the past per pt    Past Surgical History:  Procedure Laterality Date  . COLONOSCOPY    . endoscopy    . LAPAROSCOPIC TOTAL HYSTERECTOMY    . WISDOM TOOTH EXTRACTION      There were no vitals filed for this visit.   Subjective Assessment - 01/30/19 1142    Subjective  Pt presents to PT with complaints of LBP that began 2-3 months.  Pt reports a fall going up the steps and landed on the buttocks and she thinks that this was a contributing factor.  Pt responded well to dry needling in the cervical region.    Pertinent History  anxiety, chronic headaches    Diagnostic tests  x-ray: mild thoracolumbar levoscoliosis, multilevel DDD with narrowing of the L2-3 disc space with mild spondylosis    Patient Stated Goals  reduce LBP    Currently in Pain?  Yes    Pain Score  5    4-5/10   Pain Location  Back    Pain Orientation  Left;Right;Lower    Pain Descriptors / Indicators  Aching    Pain Type  Chronic pain    Pain Onset  More than a month ago    Pain Frequency  Constant    Aggravating Factors   standing, getting  out of bed, sitting, yardwork    Pain Relieving Factors  heat, stretching         [facility] PT Assessment - 01/30/19 0001      Assessment   Medical Diagnosis  DDD, lumbar spine    Referring Provider (PT)  Rodrigo RanPerini, Mark, MD    Onset Date/Surgical Date  10/30/18    Next MD Visit  none    Prior Therapy  none      Precautions   Precautions  None      Restrictions   Weight Bearing Restrictions  No      Balance Screen   Has the patient fallen in the past 6 months  Yes    How many times?  1   up the steps   Has the patient had a decrease in activity level because of a fear of falling?   No    Is the patient reluctant to leave their home because of a fear of falling?   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      Cognition   Overall Cognitive Status  Within Functional Limits for tasks assessed      Observation/Other Assessments   Focus on Therapeutic Outcomes (FOTO)   40% limitation      Posture/Postural Control   Posture/Postural Control  Postural limitations    Postural Limitations  Weight shift right;Forward head      ROM / Strength   AROM / PROM / Strength  AROM;Strength;PROM      AROM   Overall AROM   Within functional limits for tasks performed    Overall AROM Comments  lumbar A/ROM is full       PROM   Overall PROM   Within functional limits for tasks performed      Strength   Overall Strength  Within functional limits for tasks performed    Overall Strength Comments  4+/5 to 5/5 LE strength      Palpation   Spinal mobility  reduced mobility in lumbar spinal segments    Palpation comment  tension in bil lumbar paraspinals with mild pain reported      Ambulation/Gait   Ambulation/Gait  Yes    Gait Pattern  Within Functional Limits                Objective measurements completed on  examination: See above findings.        Trigger Point Dry Needling - 01/30/19 0001    Consent Given?  Yes    Education Handout Provided  Yes    Muscles Treated Back/Hip  Lumbar multifidi    Lumbar multifidi Response  Palpable increased muscle length;Twitch response elicited           PT Education - 01/30/19 1215    Education Details  Access Code: [email or web address], posture education    Person(s) Educated  Patient    Methods  Explanation;Demonstration;Handout    Comprehension  Verbalized understanding;Returned demonstration       PT Short Term Goals - 01/30/19 1306      PT SHORT TERM GOAL #1   Title  be independent in initial HEP    Time  4    Period  Weeks    Status  New    Target Date  02/27/19      PT SHORT TERM GOAL #2   Title  report a 25% reduction in LBP with housework and yardwork    Time  4    Period  Weeks    Status  New    Target Date  02/27/19      PT SHORT TERM GOAL #3   Title  demonstrate neutral seated posture and report compliance with this posture at work    Time  4    Period  Weeks    Status  New    Target Date  02/27/19        PT Long Term Goals - 01/30/19 1313      PT LONG TERM GOAL #1   Title  be independent in advanced HEP    Time  8    Period  Weeks    Status  New    Target Date  03/27/19      PT LONG TERM GOAL #2   Title  reduce FOTO to < or = to 32% limitation    Time  8    Status  New  Target Date  03/27/19      PT LONG TERM GOAL #3   Title  perform gardening with < or = to 2/10 LBP    Time  8    Period  Weeks    Status  New    Target Date  03/27/19      PT LONG TERM GOAL #4   Title  report a 60% reduction in LBP with houswork and standing tasks    Time  8    Period  Weeks    Status  New    Target Date  03/27/19      PT LONG TERM GOAL #5   Title  demonstrate and verbalize understanding of body mechanics modifications with lifting and housework/yardwork    Time  8    Period  Weeks    Status  New    Target Date   03/27/19             Plan - 01/30/19 1241    Clinical Impression Statement  Pt presents to PT with complaints of LBP that began 2-3 months ago after a fall when walking up the steps when she landed on her buttocks.  Recent x-ray showed DDD in the lumbar spine.  Pt rates pain as 4-6/10 in the low back and pain increases with standing, yardwork and with getting out of bed.  Pt demonstrates Rt lateral trunk flexion and increased weightbearing into the Rt hip in sitting.  Pt with full lumbar A/ROM with central lumbar pain reported at end range of all motion.  Hip A/ROM is WNL.  Pt with tension and trigger points in bil lumbar paraspinals Rt>Lt and reduced segmental mobility in the lumbar spine with mild pain reported.  Pt will benefit from skilled PT for core and hip strength, flexibility, manual and body mechanics education.    Personal Factors and Comorbidities  Comorbidity 1    Comorbidities  chronic migraines    Examination-Activity Limitations  Stand    Examination-Participation Restrictions  Yard Work;Cleaning    Stability/Clinical Decision Making  Stable/Uncomplicated    Clinical Decision Making  Low    Rehab Potential  Excellent    PT Frequency  2x / week    PT Duration  8 weeks    PT Treatment/Interventions  ADLs/Self Care Home Management;Cryotherapy;Electrical Stimulation;Moist Heat;Ultrasound;Therapeutic exercise;Therapeutic activities;Functional mobility training;Patient/family education;Dry needling;Passive range of motion;Manual techniques;Taping;Spinal Manipulations;Joint Manipulations    PT Next Visit Plan  dry needling to lumbar spine and gluteals, review HEP, begin core strength    PT Home Exercise Plan  Access Code: [email or web address]    Consulted and Agree with Plan of Care  Patient       Patient will benefit from skilled therapeutic intervention in order to improve the following deficits and impairments:  Decreased activity tolerance, Decreased mobility, Decreased strength,  Decreased endurance, Decreased range of motion, Difficulty walking, Increased muscle spasms, Impaired flexibility, Postural dysfunction, Improper body mechanics, Pain  Visit Diagnosis: Cramp and spasm - Plan: PT plan of care cert/re-cert  Chronic bilateral low back pain without sciatica - Plan: PT plan of care cert/re-cert     Problem List Patient Active Problem List   Diagnosis Date Noted  . Chronic migraine without aura, with intractable migraine, so stated, with status migrainosus 07/24/2018    Sigurd Sos, PT 01/30/19 1:17 PM  [facility] [address] [place], [place], [address] Phone: [phone]   Fax:  [phone]  Name: SHABREE TEBBETTS MRN:  [id number] Date of Birth: May 02, 1955

## 2019-02-06 ENCOUNTER — Other Ambulatory Visit: Payer: Self-pay

## 2019-02-06 ENCOUNTER — Ambulatory Visit: Payer: 59

## 2019-02-06 DIAGNOSIS — M545 Low back pain: Secondary | ICD-10-CM

## 2019-02-06 DIAGNOSIS — G8929 Other chronic pain: Secondary | ICD-10-CM

## 2019-02-06 DIAGNOSIS — R252 Cramp and spasm: Secondary | ICD-10-CM | POA: Diagnosis not present

## 2019-02-06 NOTE — Therapy (Signed)
[facility] [address], [address] [place], [place], [address] Phone: [phone]   Fax:  [phone]  Physical Therapy Treatment  Patient Details  Name: Michele [name] MRN: [id number] Date of Birth: 12-20-1955 Referring Provider (PT): Rodrigo Ran, MD   Encounter Date: 02/06/2019  PT End of Session - 02/06/19 1232    Visit Number  2    Date for PT Re-Evaluation  03/27/19    Authorization Type  UHC    PT Start Time  1147    PT Stop Time  1230    PT Time Calculation (min)  43 min    Activity Tolerance  Patient tolerated treatment well    Behavior During Therapy  [facility] for tasks assessed/performed       Past Medical History:  Diagnosis Date  . Anxiety   . Chronic headaches   . Dyspareunia in female   . Greater trochanteric bursitis    bilateral  . Hypersomnia   . Migraines   . Vitamin D deficiency    in the past per pt    Past Surgical History:  Procedure Laterality Date  . COLONOSCOPY    . endoscopy    . LAPAROSCOPIC TOTAL HYSTERECTOMY    . WISDOM TOOTH EXTRACTION      There were no vitals filed for this visit.  Subjective Assessment - 02/06/19 1151    Subjective  I did my planting over the weekend so I am sore.  I have been doing my stretching.    Diagnostic tests  x-ray: mild thoracolumbar levoscoliosis, multilevel DDD with narrowing of the L2-3 disc space with mild spondylosis    Currently in Pain?  Yes    Pain Score  6     Pain Location  Back    Pain Orientation  Right;Left;Lower    Pain Descriptors / Indicators  Aching    Pain Onset  More than a month ago    Pain Frequency  Constant    Aggravating Factors   it is just there, increased activity    Pain Relieving Factors  heat, stretching, biofreeze                       [facility] Adult PT Treatment/Exercise - 02/06/19 0001      Exercises   Exercises  Knee/Hip;Lumbar      Lumbar Exercises: Stretches   Active Hamstring Stretch   Left;Right;3 reps;20 seconds    Single Knee to Chest Stretch  Left;Right;3 reps;20 seconds    Lower Trunk Rotation  3 reps;20 seconds    Piriformis Stretch  Left;Right;3 reps;20 seconds      Manual Therapy   Manual Therapy  Soft tissue mobilization;Myofascial release    Manual therapy comments  bil lumbar paraspinals and gluteals       Trigger Point Dry Needling - 02/06/19 0001    Consent Given?  Yes    Muscles Treated Back/Hip  Lumbar multifidi;Gluteus minimus;Gluteus medius    Other Dry Needling  T11 and T12 thoracic multifidi    Gluteus Minimus Response  Twitch response elicited;Palpable increased muscle length    Gluteus Medius Response  Twitch response elicited;Palpable increased muscle length    Lumbar multifidi Response  Twitch response elicited;Palpable increased muscle length             PT Short Term Goals - 01/30/19 1306      PT SHORT TERM GOAL #1   Title  be independent in initial HEP  Time  4    Period  Weeks    Status  New    Target Date  02/27/19      PT SHORT TERM GOAL #2   Title  report a 25% reduction in LBP with housework and yardwork    Time  4    Period  Weeks    Status  New    Target Date  02/27/19      PT SHORT TERM GOAL #3   Title  demonstrate neutral seated posture and report compliance with this posture at work    Time  4    Period  Weeks    Status  New    Target Date  02/27/19        PT Long Term Goals - 01/30/19 1313      PT LONG TERM GOAL #1   Title  be independent in advanced HEP    Time  8    Period  Weeks    Status  New    Target Date  03/27/19      PT LONG TERM GOAL #2   Title  reduce FOTO to < or = to 32% limitation    Time  8    Status  New    Target Date  03/27/19      PT LONG TERM GOAL #3   Title  perform gardening with < or = to 2/10 LBP    Time  8    Period  Weeks    Status  New    Target Date  03/27/19      PT LONG TERM GOAL #4   Title  report a 60% reduction in LBP with houswork and standing tasks     Time  8    Period  Weeks    Status  New    Target Date  03/27/19      PT LONG TERM GOAL #5   Title  demonstrate and verbalize understanding of body mechanics modifications with lifting and housework/yardwork    Time  8    Period  Weeks    Status  New    Target Date  03/27/19            Plan - 02/06/19 1152    Clinical Impression Statement  Pt with first time follow-up after evaluation. Session focused on review of HEP and dry needling/manual to lumbar spine and gluteals.  Pt with trigger points and tension in bil lumbar paraspinals and gluteals and demonstrates improved tissue mobility and reduced stiffness after dry needling today.  Pt is consistent with flexibility exercises and PT will add strength exercises next session.  Pt will continue to benefit from skilled PT to address low back pain, stiffness and manual.    PT Frequency  2x / week    PT Duration  8 weeks    PT Treatment/Interventions  ADLs/Self Care Home Management;Cryotherapy;Electrical Stimulation;Moist Heat;Ultrasound;Therapeutic exercise;Therapeutic activities;Functional mobility training;Patient/family education;Dry needling;Passive range of motion;Manual techniques;Taping;Spinal Manipulations;Joint Manipulations    PT Next Visit Plan  assess response to dry needling, begin core strength    PT Home Exercise Plan  Access Code: [email or web address]    Consulted and Agree with Plan of Care  Patient       Patient will benefit from skilled therapeutic intervention in order to improve the following deficits and impairments:  Decreased activity tolerance, Decreased mobility, Decreased strength, Decreased endurance, Decreased range of motion, Difficulty walking, Increased muscle spasms, Impaired flexibility, Postural dysfunction, Improper body mechanics,  Pain  Visit Diagnosis: Chronic bilateral low back pain without sciatica  Cramp and spasm     Problem List Patient Active Problem List   Diagnosis Date Noted  . Chronic  migraine without aura, with intractable migraine, so stated, with status migrainosus 07/24/2018     Lorrene ReidKelly Etheleen Valtierra, PT 02/06/19 12:34 PM  [facility] [address], [address] [place], [place], [address] Phone: [phone]   Fax:  [phone]  Name: Michele [name] MRN: [id number] Date of Birth: 01/06/1956

## 2019-02-12 ENCOUNTER — Other Ambulatory Visit: Payer: Self-pay

## 2019-02-12 ENCOUNTER — Ambulatory Visit: Payer: 59

## 2019-02-12 DIAGNOSIS — M545 Low back pain: Secondary | ICD-10-CM

## 2019-02-12 DIAGNOSIS — R252 Cramp and spasm: Secondary | ICD-10-CM | POA: Diagnosis not present

## 2019-02-12 DIAGNOSIS — G8929 Other chronic pain: Secondary | ICD-10-CM

## 2019-02-12 NOTE — Therapy (Signed)
[facility] [address] [place], [place], [address] Phone: [phone]   Fax:  [phone]  Physical Therapy Treatment  Patient Details  Name: Michele [name] MRN: [id number] Date of Birth: March 26, 1956 Referring Provider (PT): Rodrigo Ran, MD   Encounter Date: 02/12/2019  PT End of Session - 02/12/19 1718    Visit Number  3    Date for PT Re-Evaluation  03/27/19    Authorization Type  UHC    PT Start Time  1620   dry needling   PT Stop Time  1658    PT Time Calculation (min)  38 min    Activity Tolerance  Patient tolerated treatment well    Behavior During Therapy  [facility] for tasks assessed/performed       Past Medical History:  Diagnosis Date  . Anxiety   . Chronic headaches   . Dyspareunia in female   . Greater trochanteric bursitis    bilateral  . Hypersomnia   . Migraines   . Vitamin D deficiency    in the past per pt    Past Surgical History:  Procedure Laterality Date  . COLONOSCOPY    . endoscopy    . LAPAROSCOPIC TOTAL HYSTERECTOMY    . WISDOM TOOTH EXTRACTION      There were no vitals filed for this visit.  Subjective Assessment - 02/12/19 1620    Subjective  I'm doing OK. I am still having back pain and it is getting better. 30% overall improvement    Pertinent History  anxiety, chronic headaches    Diagnostic tests  x-ray: mild thoracolumbar levoscoliosis, multilevel DDD with narrowing of the L2-3 disc space with mild spondylosis    Currently in Pain?  Yes    Pain Score  4     Pain Location  Back    Pain Orientation  Right;Left;Lower    Pain Descriptors / Indicators  Aching    Pain Type  Chronic pain    Pain Onset  More than a month ago    Pain Frequency  Constant    Aggravating Factors   gardening, increased activity    Pain Relieving Factors  heat, stretching, Biofreeze                       [facility] Adult PT Treatment/Exercise - 02/12/19 0001      Lumbar  Exercises: Stretches   Active Hamstring Stretch  Left;Right;3 reps;20 seconds      Manual Therapy   Manual Therapy  Soft tissue mobilization;Myofascial release    Manual therapy comments  bil lumbar paraspinals and gluteals       Trigger Point Dry Needling - 02/12/19 0001    Consent Given?  Yes    Muscles Treated Back/Hip  Lumbar multifidi;Gluteus minimus;Gluteus medius    Other Dry Needling  T11 and T12 thoracic multifidi    Gluteus Minimus Response  Twitch response elicited;Palpable increased muscle length    Gluteus Medius Response  Twitch response elicited;Palpable increased muscle length    Lumbar multifidi Response  Twitch response elicited;Palpable increased muscle length             PT Short Term Goals - 02/12/19 1622      PT SHORT TERM GOAL #1   Title  be independent in initial HEP    Status  Achieved      PT SHORT TERM GOAL #2   Title  report a 25% reduction in LBP with  housework and yardwork    Baseline  30% limitation    Status  Achieved      PT SHORT TERM GOAL #3   Title  demonstrate neutral seated posture and report compliance with this posture at work    Status  Achieved        PT Long Term Goals - 01/30/19 1313      PT LONG TERM GOAL #1   Title  be independent in advanced HEP    Time  8    Period  Weeks    Status  New    Target Date  03/27/19      PT LONG TERM GOAL #2   Title  reduce FOTO to < or = to 32% limitation    Time  8    Status  New    Target Date  03/27/19      PT LONG TERM GOAL #3   Title  perform gardening with < or = to 2/10 LBP    Time  8    Period  Weeks    Status  New    Target Date  03/27/19      PT LONG TERM GOAL #4   Title  report a 60% reduction in LBP with houswork and standing tasks    Time  8    Period  Weeks    Status  New    Target Date  03/27/19      PT LONG TERM GOAL #5   Title  demonstrate and verbalize understanding of body mechanics modifications with lifting and housework/yardwork    Time  8     Period  Weeks    Status  New    Target Date  03/27/19            Plan - 02/12/19 1718    Clinical Impression Statement  Session focused on dry needling and manual therapy to address lumbar and gluteal region.  Pt is independent and compliant with HEP for flexibility and requested to wait to add new exercises until next session.  Pt with significant reduction in the size and number of trigger points in the lumbar and thoracic spine.  Pt reports 30% overall reduction in pain and is making frequent postural corrections at home and work.  Pt will benefit from continued PT to address LBP and improve strength of core and hips.    PT Frequency  2x / week    PT Duration  8 weeks    PT Treatment/Interventions  ADLs/Self Care Home Management;Cryotherapy;Electrical Stimulation;Moist Heat;Ultrasound;Therapeutic exercise;Therapeutic activities;Functional mobility training;Patient/family education;Dry needling;Passive range of motion;Manual techniques;Taping;Spinal Manipulations;Joint Manipulations    PT Next Visit Plan  assess response to dry needling, begin core strength    PT Home Exercise Plan  Access Code: [email or web address]    Consulted and Agree with Plan of Care  Patient       Patient will benefit from skilled therapeutic intervention in order to improve the following deficits and impairments:  Decreased activity tolerance, Decreased mobility, Decreased strength, Decreased endurance, Decreased range of motion, Difficulty walking, Increased muscle spasms, Impaired flexibility, Postural dysfunction, Improper body mechanics, Pain  Visit Diagnosis: Chronic bilateral low back pain without sciatica  Cramp and spasm     Problem List Patient Active Problem List   Diagnosis Date Noted  . Chronic migraine without aura, with intractable migraine, so stated, with status migrainosus 07/24/2018     Sigurd Sos, PT 02/12/19 5:21 PM  [facility] [address], [address] [place], [place], [address] Phone: [phone]   Fax:  [phone]  Name: Michele [name] MRN: [id number] Date of Birth: 10/11/1955

## 2019-02-19 ENCOUNTER — Ambulatory Visit: Payer: 59 | Admitting: Physical Therapy

## 2019-02-26 ENCOUNTER — Ambulatory Visit: Payer: 59 | Attending: Internal Medicine | Admitting: Physical Therapy

## 2019-02-26 ENCOUNTER — Other Ambulatory Visit: Payer: Self-pay

## 2019-02-26 DIAGNOSIS — M545 Low back pain, unspecified: Secondary | ICD-10-CM

## 2019-02-26 DIAGNOSIS — M542 Cervicalgia: Secondary | ICD-10-CM | POA: Insufficient documentation

## 2019-02-26 DIAGNOSIS — G8929 Other chronic pain: Secondary | ICD-10-CM

## 2019-02-26 DIAGNOSIS — R252 Cramp and spasm: Secondary | ICD-10-CM | POA: Insufficient documentation

## 2019-02-26 NOTE — Therapy (Addendum)
[facility] [address] [place], [place], [address] Phone: [phone]   Fax:  [phone]  Physical Therapy Treatment  Patient Details  Name: Michele [name] MRN: [id number] Date of Birth: [birth year] Referring Provider (PT): Crist Infante, MD   Encounter Date: 02/26/2019  PT End of Session - 02/26/19 1540    Visit Number  4    Date for PT Re-Evaluation  03/27/19    Authorization Type  [employer]    PT Start Time  6237    PT Stop Time  1613    PT Time Calculation (min)  39 min    Activity Tolerance  Patient tolerated treatment well    Behavior During Therapy  [facility] for tasks assessed/performed       Past Medical History:  Diagnosis Date  . Anxiety   . Chronic headaches   . Dyspareunia in female   . Greater trochanteric bursitis    bilateral  . Hypersomnia   . Migraines   . Vitamin D deficiency    in the past per pt    Past Surgical History:  Procedure Laterality Date  . COLONOSCOPY    . endoscopy    . LAPAROSCOPIC TOTAL HYSTERECTOMY    . [name]      There were no vitals filed for this visit.  Subjective Assessment - 02/26/19 1543    Subjective  I am having less radiating pain. It was really sore after the last treatment but that got better.    Currently in Pain?  Yes    Pain Score  4     Pain Location  Hip    Pain Orientation  Right    Pain Descriptors / Indicators  Aching    Pain Type  Chronic pain    Pain Onset  More than a month ago    Pain Frequency  Intermittent    Aggravating Factors   gardening    Multiple Pain Sites  No                       [facility] Adult PT Treatment/Exercise - 02/26/19 0001      Neuro Re-ed    Neuro Re-ed Details   educated and performed how to engage TrA, diaphragmatic breathing to initiate TrA      Lumbar Exercises: Stretches   Active Hamstring Stretch  Left;Right;3 reps;20 seconds      Lumbar Exercises: Aerobic   UBE (Upper Arm Bike)   L1 - 3x3 fwd/back - PT present for status update      Lumbar Exercises: Supine   Ab Set  20 reps;3 seconds    Bent Knee Raise  20 reps;2 seconds    Other Supine Lumbar Exercises  bent knee fall out - 20x             PT Education - 02/26/19 1614    Education Details  Access Code: [email or web address]    Person(s) Educated  Patient    Methods  Explanation;Demonstration;Handout;Verbal cues    Comprehension  Verbalized understanding;Returned demonstration       PT Short Term Goals - 02/12/19 1622      PT SHORT TERM GOAL #1   Title  be independent in initial HEP    Status  Achieved      PT SHORT TERM GOAL #2   Title  report a 25% reduction in LBP with housework and yardwork    Baseline  30% limitation  Status  Achieved      PT SHORT TERM GOAL #3   Title  demonstrate neutral seated posture and report compliance with this posture at work    Status  Achieved        PT Long Term Goals - 01/30/19 1313      PT LONG TERM GOAL #1   Title  be independent in advanced HEP    Time  8    Period  Weeks    Status  New    Target Date  03/27/19      PT LONG TERM GOAL #2   Title  reduce FOTO to < or = to 32% limitation    Time  8    Status  New    Target Date  03/27/19      PT LONG TERM GOAL #3   Title  perform gardening with < or = to 2/10 LBP    Time  8    Period  Weeks    Status  New    Target Date  03/27/19      PT LONG TERM GOAL #4   Title  report a 60% reduction in LBP with houswork and standing tasks    Time  8    Period  Weeks    Status  New    Target Date  03/27/19      PT LONG TERM GOAL #5   Title  demonstrate and verbalize understanding of body mechanics modifications with lifting and housework/yardwork    Time  8    Period  Weeks    Status  New    Target Date  03/27/19            Plan - 02/26/19 1655    Clinical Impression Statement  Pt was able to initiate core strength today.  She needed a lot of cues not to do a pelvic tilt.  Pt tends to over  compensate for TrA weakness with increased rectus abdominus.  Pt will continue to benefit from skilled PT for learning to engage TrA during functional activities so she can reduce risk of injury and improve quality of life.    PT Treatment/Interventions  ADLs/Self Care Home Management;Cryotherapy;Electrical Stimulation;Moist Heat;Ultrasound;Therapeutic exercise;Therapeutic activities;Functional mobility training;Patient/family education;Dry needling;Passive range of motion;Manual techniques;Taping;Spinal Manipulations;Joint Manipulations    PT Next Visit Plan  assess response to dry needling, begin core strength    PT Home Exercise Plan  Access Code: [email or web address]    Recommended Other Services  cert signed    Consulted and Agree with Plan of Care  Patient       Patient will benefit from skilled therapeutic intervention in order to improve the following deficits and impairments:  Decreased activity tolerance, Decreased mobility, Decreased strength, Decreased endurance, Decreased range of motion, Difficulty walking, Increased muscle spasms, Impaired flexibility, Postural dysfunction, Improper body mechanics, Pain  Visit Diagnosis: Chronic bilateral low back pain without sciatica  Cramp and spasm  Cervicalgia     Problem List Patient Active Problem List   Diagnosis Date Noted  . Chronic migraine without aura, with intractable migraine, so stated, with status migrainosus 07/24/2018    Jule Ser, PT 02/26/2019, 5:20 PM PHYSICAL THERAPY DISCHARGE SUMMARY  Visits from Start of Care: 4  Current functional level related to goals / functional outcomes: Pt canceled remaining appointments and didn't return to PT.     Remaining deficits: See above for most current PT status.   Education / Equipment: HEP, Banker Plan: Patient  agrees to discharge.  Patient goals were partially met. Patient is being discharged due to not returning since the last visit.  ?????         Sigurd Sos, PT 05/15/19 12:40 PM  [facility] [address] [place], [place], [address] Phone: [phone]   Fax:  [phone]  Name: Michele [name] MRN: [id number] Date of Birth: May 13, 1955

## 2019-02-26 NOTE — Patient Instructions (Signed)
Access Code: [email or web address]  URL: [email or web address]  Date: 02/26/2019  Prepared by: Jari Favre   Exercises  Supine Lower Trunk Rotation - 3 reps - 1 sets - 20 hold - 1x daily - 7x weekly  Hooklying Single Knee to Chest - 3 reps - 1 sets - 20 hold - 3x daily - 7x weekly  Seated Hamstring Stretch - 3 reps - 1 sets - 20 hold - 3x daily - 7x weekly  Seated Piriformis Stretch with Trunk Bend - 3 reps - 1 sets - 20 hold - 3x daily - 7x weekly  Hooklying Small March - 10 reps - 2 sets - 1x daily - 7x weekly  Bent Knee Fallouts - 10 reps - 3 sets - 1x daily - 7x weekly  Clamshell - 10 reps - 3 sets - 1x daily - 7x weekly

## 2019-03-05 ENCOUNTER — Ambulatory Visit: Payer: 59

## 2019-03-12 ENCOUNTER — Ambulatory Visit: Payer: 59

## 2019-03-21 ENCOUNTER — Ambulatory Visit: Payer: 59 | Attending: Internal Medicine

## 2019-03-23 ENCOUNTER — Other Ambulatory Visit: Payer: Self-pay | Admitting: Neurology

## 2019-03-23 MED ORDER — ACETAMINOPHEN-CODEINE #3 300-30 MG PO TABS: 1.0000 | ORAL_TABLET | ORAL | 0 refills | Status: DC | PRN

## 2019-03-23 MED ORDER — METHYLPREDNISOLONE 4 MG PO TBPK: ORAL_TABLET | ORAL | 1 refills | Status: DC

## 2019-03-26 ENCOUNTER — Telehealth: Payer: Self-pay

## 2019-03-26 ENCOUNTER — Ambulatory Visit: Payer: 59

## 2019-03-26 NOTE — Telephone Encounter (Signed)
PT called pt due to missed appointment.  Left message on voicemail.  No further PT sessions are scheduled.

## 2019-03-27 ENCOUNTER — Telehealth: Payer: Self-pay | Admitting: Neurology

## 2019-03-27 ENCOUNTER — Ambulatory Visit (INDEPENDENT_AMBULATORY_CARE_PROVIDER_SITE_OTHER): Payer: 59 | Admitting: Neurology

## 2019-03-27 ENCOUNTER — Other Ambulatory Visit: Payer: Self-pay

## 2019-03-27 VITALS — Temp 97.5°F

## 2019-03-27 DIAGNOSIS — G43711 Chronic migraine without aura, intractable, with status migrainosus: Secondary | ICD-10-CM | POA: Diagnosis not present

## 2019-03-27 NOTE — Telephone Encounter (Signed)
Call patient please. I am happy to perform nerve blocks at 430. However please screen for symptoms of covid, and see if the office will let her in.

## 2019-03-27 NOTE — Progress Notes (Signed)
Nerve block w/o steroid: Pt signed consent  0.5% Bupivocaine 9 mL LOT: [id number] EXP: 02/2020 NDC: 12458-099-83  2% Lidocaine 9 mL LOT: [id number] EXP: 10/18/2019 NDC: 3825-0539-76  Orders written per Dr. Jaynee Eagles for Depacon 1 gram IV x 1, Solumedrol 250 mg IV x 1, Compazine 10 mg IV x 1, and Benadryl 25 mg IV x 1. Orders given to [facility] RN w/ [facility]. Also provided demographics/insurance information.

## 2019-03-27 NOTE — Telephone Encounter (Signed)
Spoke with patient and she vomited at 2am and has not since. She feels better. She think it was food posion. Her headache is a migraine that she has had for days. She will be ok to come in practice this afternoon for nerve block. I advised her to call if she had any other symptoms and started vomiting again.

## 2019-03-27 NOTE — Telephone Encounter (Signed)
Noted thanks °

## 2019-03-27 NOTE — Progress Notes (Signed)
Severe migraine for 1 week, has not been able to go to work, out today, not going tomorrow. Performing nerve blocks, gave more nurtec samples, also infusion with depacon, steroids, compazine and benadryl. Does not tolerate triptans or toradol. Performed by Dr. Ephraim Hamburger.D.All procedures a documented blood were medically necessary, reasonable and appropriate based on the patient's history, medical diagnosis and physician opinion. Verbal informed consent was obtained from the patient, patient was informed of potential risk of procedure, including bruising, bleeding, hematoma formation, infection, muscle weakness, muscle pain, numbness, transient hypertension, transient hyperglycemia and transient insomnia among others. All areas injected were topically clean with isopropyl rubbing alcohol. Nonsterile nonlatex gloves were worn during the procedure.  1. . Supraorbital nerve block (64400): Supraorbital nerve site was identified along the incision of the frontal bone on the orbital/supraorbital ridge. Medication was injected into the left and right supraorbital nerve areas. Patient's condition is associated with inflammation of the supraorbital and associated muscle groups. Injection was deemed medically necessary, reasonable and appropriate. Injection represents a separate and unique surgical service.

## 2019-04-04 ENCOUNTER — Other Ambulatory Visit: Payer: Self-pay | Admitting: Neurology

## 2019-04-04 MED ORDER — ONDANSETRON 4 MG PO TBDP: 4.0000 mg | ORAL_TABLET | Freq: Three times a day (TID) | ORAL | 11 refills | Status: DC | PRN

## 2019-04-04 MED ORDER — OXYCODONE-ACETAMINOPHEN 10-325 MG PO TABS: 1.0000 | ORAL_TABLET | ORAL | 0 refills | Status: DC | PRN

## 2019-04-25 ENCOUNTER — Telehealth: Payer: Self-pay

## 2019-05-08 ENCOUNTER — Other Ambulatory Visit: Payer: Self-pay

## 2019-05-08 ENCOUNTER — Ambulatory Visit: Payer: 59 | Admitting: Neurology

## 2019-05-08 VITALS — Temp 97.3°F

## 2019-05-08 DIAGNOSIS — G43711 Chronic migraine without aura, intractable, with status migrainosus: Secondary | ICD-10-CM | POA: Diagnosis not present

## 2019-05-08 MED ORDER — DESVENLAFAXINE SUCCINATE ER 50 MG PO TB24: 50.0000 mg | ORAL_TABLET | Freq: Two times a day (BID) | ORAL | 6 refills | Status: DC

## 2019-05-08 NOTE — Progress Notes (Signed)
Consent Form Botulism Toxin Injection For Chronic Migraine  05/08/2019: She is significantly better, migraines >70% improved in frequency and severity. She is on Nurtec and likes it acutely.Some stress in life, we will change to pristiq, she has tried multiple other depression medications (cymbalta, effexor, lexapro, prozac, paxil, zoloft and many more)  Reviewed orally with patient, additionally signature is on file:  Botulism toxin has been approved by the Federal drug administration for treatment of chronic migraine. Botulism toxin does not cure chronic migraine and it may not be effective in some patients.  The administration of botulism toxin is accomplished by injecting a small amount of toxin into the muscles of the neck and head. Dosage must be titrated for each individual. Any benefits resulting from botulism toxin tend to wear off after 3 months with a repeat injection required if benefit is to be maintained. Injections are usually done every 3-4 months with maximum effect peak achieved by about 2 or 3 weeks. Botulism toxin is expensive and you should be sure of what costs you will incur resulting from the injection.  The side effects of botulism toxin use for chronic migraine may include:   -Transient, and usually mild, facial weakness with facial injections  -Transient, and usually mild, head or neck weakness with head/neck injections  -Reduction or loss of forehead facial animation due to forehead muscle weakness  -Eyelid drooping  -Dry eye  -Pain at the site of injection or bruising at the site of injection  -Double vision  -Potential unknown long term risks  Contraindications: You should not have Botox if you are pregnant, nursing, allergic to albumin, have an infection, skin condition, or muscle weakness at the site of the injection, or have myasthenia gravis, Lambert-Eaton syndrome, or ALS.  It is also possible that as with any injection, there may be an allergic reaction or  no effect from the medication. Reduced effectiveness after repeated injections is sometimes seen and rarely infection at the injection site may occur. All care will be taken to prevent these side effects. If therapy is given over a long time, atrophy and wasting in the muscle injected may occur. Occasionally the patient's become refractory to treatment because they develop antibodies to the toxin. In this event, therapy needs to be modified.  I have read the above information and consent to the administration of botulism toxin.    BOTOX PROCEDURE NOTE FOR MIGRAINE HEADACHE    Contraindications and precautions discussed with patient(above). Aseptic procedure was observed and patient tolerated procedure. Procedure performed by Dr. Georgia Dom  The condition has existed for more than 6 months, and pt does not have a diagnosis of ALS, Myasthenia Gravis or Lambert-Eaton Syndrome.  Risks and benefits of injections discussed and pt agrees to proceed with the procedure.  Written consent obtained  These injections are medically necessary. Pt  receives good benefits from these injections. These injections do not cause sedations or hallucinations which the oral therapies may cause.  Description of procedure:  The patient was placed in a sitting position. The standard protocol was used for Botox as follows, with 5 units of Botox injected at each site:   -Procerus muscle, midline injection  -Corrugator muscle, bilateral injection  -Frontalis muscle, bilateral injection, with 2 sites each side, medial injection was performed in the upper one third of the frontalis muscle, in the region vertical from the medial inferior edge of the superior orbital rim. The lateral injection was again in the upper one third of the forehead  vertically above the lateral limbus of the cornea, 1.5 cm lateral to the medial injection site.  -Temporalis muscle injection, 4 sites, bilaterally. The first injection was 3 cm above  the tragus of the ear, second injection site was 1.5 cm to 3 cm up from the first injection site in line with the tragus of the ear. The third injection site was 1.5-3 cm forward between the first 2 injection sites. The fourth injection site was 1.5 cm posterior to the second injection site.   -Occipitalis muscle injection, 3 sites, bilaterally. The first injection was done one half way between the occipital protuberance and the tip of the mastoid process behind the ear. The second injection site was done lateral and superior to the first, 1 fingerbreadth from the first injection. The third injection site was 1 fingerbreadth superiorly and medially from the first injection site.  -Cervical paraspinal muscle injection, 2 sites, bilateral knee first injection site was 1 cm from the midline of the cervical spine, 3 cm inferior to the lower border of the occipital protuberance. The second injection site was 1.5 cm superiorly and laterally to the first injection site.  -Trapezius muscle injection was performed at 3 sites, bilaterally. The first injection site was in the upper trapezius muscle halfway between the inflection point of the neck, and the acromion. The second injection site was one half way between the acromion and the first injection site. The third injection was done between the first injection site and the inflection point of the neck.   Will return for repeat injection in 3 months.   200 units of Botox was used, any Botox not injected was wasted. The patient tolerated the procedure well, there were no complications of the above procedure.

## 2019-05-08 NOTE — Progress Notes (Signed)
Botox- 100 units x 2 vials Lot: [id number] Expiration: 01/2022 NDC: 7408-1448-18  Bacteriostatic 0.9% Sodium Chloride- 36mL total Lot: [id number] Expiration: 07/19/2019 NDC: 7026-3785-88  Dx: F02.774 S/P

## 2019-06-12 ENCOUNTER — Other Ambulatory Visit: Payer: Self-pay | Admitting: Neurology

## 2019-06-12 MED ORDER — CITALOPRAM HYDROBROMIDE 40 MG PO TABS: 40.0000 mg | ORAL_TABLET | Freq: Every day | ORAL | 6 refills | Status: DC

## 2019-06-28 ENCOUNTER — Encounter: Payer: Self-pay | Admitting: *Deleted

## 2019-07-03 ENCOUNTER — Other Ambulatory Visit: Payer: Self-pay

## 2019-07-03 ENCOUNTER — Ambulatory Visit (INDEPENDENT_AMBULATORY_CARE_PROVIDER_SITE_OTHER): Payer: 59 | Admitting: Neurology

## 2019-07-03 VITALS — Temp 97.5°F

## 2019-07-03 DIAGNOSIS — G43711 Chronic migraine without aura, intractable, with status migrainosus: Secondary | ICD-10-CM

## 2019-07-03 MED ORDER — METOCLOPRAMIDE HCL 10 MG PO TABS: 10.0000 mg | ORAL_TABLET | Freq: Four times a day (QID) | ORAL | 6 refills | Status: DC | PRN

## 2019-07-03 NOTE — Progress Notes (Signed)
Nerve block w/o steroid: Pt signed consent  0.5% Bupivocaine 10.5 mL LOT: [id number] EXP: 02/2020 NDC: 84166-063-01  2% Lidocaine 10.5 mL LOT: [id number] EXP: 10/18/2019 NDC: 6010-9323-55

## 2019-07-03 NOTE — Progress Notes (Signed)
Severe migraine for 1 day, did not go to work, Performing nerve blocks, gave reglan for nausea (zofran dry mouth)  also infusion with depacon, steroids, (NOT compazine or benadryl due to side effects last time). Does not tolerate triptans or toradol. Performed by Dr. Barry Dienes.D.All procedures a documented blood were medically necessary, reasonable and appropriate based on the patient's history, medical diagnosis and physician opinion. Verbal informed consent was obtained from the patient, patient was informed of potential risk of procedure, including bruising, bleeding, hematoma formation, infection, muscle weakness, muscle pain, numbness, transient hypertension, transient hyperglycemia and transient insomnia among others. All areas injected were topically clean with isopropyl rubbing alcohol. Nonsterile nonlatex gloves were worn during the procedure.  1. . Supraorbital nerve block (64400): Supraorbital nerve site was identified along the incision of the frontal bone on the orbital/supraorbital ridge. Medication was injected into the left and right supraorbital nerve areas. Patient's condition is associated with inflammation of the supraorbital and associated muscle groups. Injection was deemed medically necessary, reasonable and appropriate. Injection represents a separate and unique surgical service.

## 2019-07-04 ENCOUNTER — Ambulatory Visit (INDEPENDENT_AMBULATORY_CARE_PROVIDER_SITE_OTHER): Payer: 59 | Admitting: Neurology

## 2019-07-04 VITALS — Temp 97.1°F

## 2019-07-04 DIAGNOSIS — G43711 Chronic migraine without aura, intractable, with status migrainosus: Secondary | ICD-10-CM | POA: Diagnosis not present

## 2019-07-04 NOTE — Progress Notes (Signed)
Botox- 200 units x 1 vial Lot: [id number] Expiration: 10/2021 NDC: 2951-8841-66  Bacteriostatic 0.9% Sodium Chloride- 25mL total Lot: [id number] Expiration:07/19/2019 NDC: 0109-3235-57  Dx: G43.711 samples

## 2019-07-04 NOTE — Progress Notes (Signed)
Consent Form Botulism Toxin Injection For Chronic Migraine    Reviewed orally with patient, additionally signature is on file:  Botulism toxin has been approved by the Federal drug administration for treatment of chronic migraine. Botulism toxin does not cure chronic migraine and it may not be effective in some patients.  The administration of botulism toxin is accomplished by injecting a small amount of toxin into the muscles of the neck and head. Dosage must be titrated for each individual. Any benefits resulting from botulism toxin tend to wear off after 3 months with a repeat injection required if benefit is to be maintained. Injections are usually done every 3-4 months with maximum effect peak achieved by about 2 or 3 weeks. Botulism toxin is expensive and you should be sure of what costs you will incur resulting from the injection.  The side effects of botulism toxin use for chronic migraine may include:   -Transient, and usually mild, facial weakness with facial injections  -Transient, and usually mild, head or neck weakness with head/neck injections  -Reduction or loss of forehead facial animation due to forehead muscle weakness  -Eyelid drooping  -Dry eye  -Pain at the site of injection or bruising at the site of injection  -Double vision  -Potential unknown long term risks  Contraindications: You should not have Botox if you are pregnant, nursing, allergic to albumin, have an infection, skin condition, or muscle weakness at the site of the injection, or have myasthenia gravis, Lambert-Eaton syndrome, or ALS.  It is also possible that as with any injection, there may be an allergic reaction or no effect from the medication. Reduced effectiveness after repeated injections is sometimes seen and rarely infection at the injection site may occur. All care will be taken to prevent these side effects. If therapy is given over a long time, atrophy and wasting in the muscle injected may  occur. Occasionally the patient's become refractory to treatment because they develop antibodies to the toxin. In this event, therapy needs to be modified.  I have read the above information and consent to the administration of botulism toxin.    BOTOX PROCEDURE NOTE FOR MIGRAINE HEADACHE    Contraindications and precautions discussed with patient(above). Aseptic procedure was observed and patient tolerated procedure. Procedure performed by Dr. Toni Deejay Koppelman  The condition has existed for more than 6 months, and pt does not have a diagnosis of ALS, Myasthenia Gravis or Lambert-Eaton Syndrome.  Risks and benefits of injections discussed and pt agrees to proceed with the procedure.  Written consent obtained  These injections are medically necessary. Pt  receives good benefits from these injections. These injections do not cause sedations or hallucinations which the oral therapies may cause.  Description of procedure:  The patient was placed in a sitting position. The standard protocol was used for Botox as follows, with 5 units of Botox injected at each site:   -Procerus muscle, midline injection  -Corrugator muscle, bilateral injection  -Frontalis muscle, bilateral injection, with 2 sites each side, medial injection was performed in the upper one third of the frontalis muscle, in the region vertical from the medial inferior edge of the superior orbital rim. The lateral injection was again in the upper one third of the forehead vertically above the lateral limbus of the cornea, 1.5 cm lateral to the medial injection site.  -Temporalis muscle injection, 4 sites, bilaterally. The first injection was 3 cm above the tragus of the ear, second injection site was 1.5 cm to 3   cm up from the first injection site in line with the tragus of the ear. The third injection site was 1.5-3 cm forward between the first 2 injection sites. The fourth injection site was 1.5 cm posterior to the second injection  site.   -Occipitalis muscle injection, 3 sites, bilaterally. The first injection was done one half way between the occipital protuberance and the tip of the mastoid process behind the ear. The second injection site was done lateral and superior to the first, 1 fingerbreadth from the first injection. The third injection site was 1 fingerbreadth superiorly and medially from the first injection site.  -Cervical paraspinal muscle injection, 2 sites, bilateral knee first injection site was 1 cm from the midline of the cervical spine, 3 cm inferior to the lower border of the occipital protuberance. The second injection site was 1.5 cm superiorly and laterally to the first injection site.  -Trapezius muscle injection was performed at 3 sites, bilaterally. The first injection site was in the upper trapezius muscle halfway between the inflection point of the neck, and the acromion. The second injection site was one half way between the acromion and the first injection site. The third injection was done between the first injection site and the inflection point of the neck.   Will return for repeat injection in 3 months.   200 units of Botox was used, any Botox not injected was wasted. The patient tolerated the procedure well, there were no complications of the above procedure.   I spent 45 minutes of face-to-face and non-face-to-face time with patient on the  1. Chronic migraine without aura, with intractable migraine, so stated, with status migrainosus    diagnosis.  This included previsit chart review, lab review, study review, order entry, electronic health record documentation, patient education on the different diagnostic and therapeutic options, counseling and coordination of care, risks and benefits of management, compliance, or risk factor reduction

## 2019-07-05 ENCOUNTER — Other Ambulatory Visit: Payer: Self-pay | Admitting: Neurology

## 2019-08-07 ENCOUNTER — Ambulatory Visit: Payer: Self-pay | Admitting: Neurology

## 2019-08-15 ENCOUNTER — Telehealth: Payer: Self-pay | Admitting: *Deleted

## 2019-08-15 NOTE — Telephone Encounter (Signed)
Patient has a Botox appointment on 08/29/2019.  I called [employer] and spoke to [place].  She states that J0585 and 58832 are valid and billable.  No PA required.  Must use [facility] for medication.  Ref# for call [id number].  I called [facility] to check the status of the prescription and schedule delivery.  They state this is pending medical review and then they need patient consent.  She asked me to have the patient call them Around May 6 and the review should be complete and they can go over patient benefits with her and get consent.  They will call us to schedule delivery.

## 2019-08-15 NOTE — Telephone Encounter (Signed)
I spoke to [place] at [facility].

## 2019-08-17 ENCOUNTER — Other Ambulatory Visit: Payer: Self-pay | Admitting: Neurology

## 2019-08-17 DIAGNOSIS — G43711 Chronic migraine without aura, intractable, with status migrainosus: Secondary | ICD-10-CM

## 2019-08-17 MED ORDER — UBRELVY 100 MG PO TABS
100.0000 mg | ORAL_TABLET | ORAL | 11 refills | Status: DC | PRN
Start: 2019-08-17 — End: 2019-08-21

## 2019-08-17 MED ORDER — EFFEXOR XR 150 MG PO CP24: 150.0000 mg | ORAL_CAPSULE | Freq: Every day | ORAL | 3 refills | Status: DC

## 2019-08-17 MED ORDER — TROKENDI XR 50 MG PO CP24: 50.0000 mg | ORAL_CAPSULE | Freq: Every day | ORAL | 6 refills | Status: DC

## 2019-08-20 ENCOUNTER — Telehealth: Payer: Self-pay | Admitting: *Deleted

## 2019-08-20 NOTE — Telephone Encounter (Signed)
Schedule her for 8-10 weeks thanks

## 2019-08-20 NOTE — Telephone Encounter (Signed)
I called patient to ask her to call [facility] to give consent and go over her benefits in order to schedule Botox delivery.  Patient states that she got Botox yesterday so doesn't think she will need her appointment next week.  Do we need to cancel that appointment?   I asked her if she would like to schedule her next Botox appointment but she states she thinks she will need to have it earlier than 3 months out.  Please advise.

## 2019-08-21 ENCOUNTER — Telehealth: Payer: Self-pay | Admitting: *Deleted

## 2019-08-21 ENCOUNTER — Encounter: Payer: Self-pay | Admitting: *Deleted

## 2019-08-21 ENCOUNTER — Other Ambulatory Visit: Payer: Self-pay | Admitting: Neurology

## 2019-08-21 DIAGNOSIS — G43711 Chronic migraine without aura, intractable, with status migrainosus: Secondary | ICD-10-CM

## 2019-08-21 MED ORDER — UBRELVY 100 MG PO TABS: 100.0000 mg | ORAL_TABLET | ORAL | 11 refills | Status: DC | PRN

## 2019-08-21 NOTE — Progress Notes (Signed)
ubrelvy

## 2019-08-21 NOTE — Telephone Encounter (Signed)
Request Reference Number: [id number]. TROKENDI XR CAP 50MG  is approved through 08/20/2020. Your patient may now fill this prescription and it will be covered.

## 2019-08-21 NOTE — Telephone Encounter (Signed)
I returned patients call and got no answer.  Message left with an appointment date and time requesting a call back if the day does not work with her schedule.

## 2019-08-21 NOTE — Telephone Encounter (Signed)
Completed Trokendi XR 50 mg PA on Cover My Meds. Key: [id number] - PA Case ID: [id number]. Awaiting determination from [facility].

## 2019-08-21 NOTE — Telephone Encounter (Signed)
Completed Effexor XR PA on CMM. Key: [id number]. Awaiting [facility] determination.

## 2019-08-22 ENCOUNTER — Encounter: Payer: Self-pay | Admitting: *Deleted

## 2019-08-22 NOTE — Telephone Encounter (Addendum)
Received Effexor XR denial. Letter states pt must have tried generic Effexor XR. Appeals allowed. Dr. Lucia Gaskins aware. Pt has tried generic Effexor XR years ago and it worked better. She reported to me yesterday that she has tried various generic equivalents of Effexor XR and they either caused anxiety or did not work. I called [facility] to provide this new clarification. I was told a new PA would be needed and they would generate new code on Cover My Meds. Reference # for call [id number]. Spoke with [place], [facility] representative.   Completed PA. KEYHilaria [name]. Awaiting determination from [facility].

## 2019-08-23 NOTE — Telephone Encounter (Signed)
EFFEXOR XR 150 mg approved by [employer] [facility]. I faxed the approval notice to pt's pharmacy. Received a receipt of confirmation.

## 2019-08-28 NOTE — Telephone Encounter (Signed)
Spoke with Corinne @ [facility] [address]. She stated pt recently picked up Trokendi & Effexor. The Bernita Raisin was not approved by insurance however she was able to get the claim to process today by using the savings card. She will get this ready for the patient.

## 2019-08-28 NOTE — Telephone Encounter (Signed)
Patient is not on emgality.

## 2019-08-28 NOTE — Telephone Encounter (Signed)
Spoke with patient on phone. It was the [place]. She will pick-up the prescription. She verbalized appreciation for the call.

## 2019-08-28 NOTE — Telephone Encounter (Signed)
Pt has called asking for a call from [facility] re: the difficulty she is having in getting her [employer]

## 2019-08-29 ENCOUNTER — Ambulatory Visit: Payer: Self-pay | Admitting: Neurology

## 2019-09-11 ENCOUNTER — Ambulatory Visit: Payer: Self-pay | Admitting: Neurology

## 2019-09-13 ENCOUNTER — Telehealth: Payer: Self-pay | Admitting: *Deleted

## 2019-09-13 NOTE — Telephone Encounter (Signed)
We received another [place] PA. Key: [id number]. I initiated it online but the PA asks more detailed questions about the length of time the pt took the preferred triptans. I tried to reach the pt but was unable. I will send mychart message.

## 2019-09-24 ENCOUNTER — Telehealth: Payer: Self-pay | Admitting: Neurology

## 2019-09-24 NOTE — Telephone Encounter (Signed)
Patient called stated she needs a refill on hydrocodone.

## 2019-09-25 NOTE — Telephone Encounter (Signed)
Per [place] registry, last filled on 04/04/2019 Oxycodone-Acetaminophen 10-325 #56.00 ordered by Dr. Lucia Gaskins.

## 2019-09-25 NOTE — Telephone Encounter (Signed)
Spoke with Dr. Vickey Huger, work-in physician. This will be deferred to Dr. Lucia Gaskins upon her return to office.

## 2019-09-25 NOTE — Telephone Encounter (Signed)
I tried to reach the pt. LVM asking for call back. We do not have a DPR on file. The [place] PA is asking how long the patient tried the triptans. In our notes it says patient tried many of them. Can she tell us exactly which ones she tried and for how long?

## 2019-09-27 NOTE — Telephone Encounter (Signed)
Spoke with pt. She said she couldn't tolerate Imitrex and one other (?Maxalt). She had paresthesias and they made her sick. She felt worse after taking them and said she wasn't going to take any more since she didn't tolerate them.  Bernita Raisin PA Key: [id number] completed on Cover My Meds. Awaiting determination.

## 2019-09-27 NOTE — Telephone Encounter (Signed)
I spoke with the patient and we discussed why Dr. Lucia Gaskins can no longer refill the Oxycodone. The pt verbalized understanding and she will discuss management with Dr. Waynard Edwards. Her questions were answered. Also during the call, I asked her about how long she had tried the triptans in the past. She said they were not tried long because she had a bad reaction to two of them. They made her sick and she had paresthesias.

## 2019-09-27 NOTE — Telephone Encounter (Signed)
Cannot fill prescription for opioids due to Tramadol already being administered #90 a month. She is also on Alprazolam. One physician needs to manage all controlled substances, please follow up with Dr. Waynard Edwards for management thanks

## 2019-09-27 NOTE — Telephone Encounter (Signed)
Checked updated registry before sending to Dr. Lucia Gaskins. No fills of any controlled substance since 09/23/19. Also note patient receives Tramadol #90 per month.

## 2019-10-04 ENCOUNTER — Other Ambulatory Visit: Payer: Self-pay | Admitting: Neurology

## 2019-10-04 ENCOUNTER — Encounter: Payer: Self-pay | Admitting: *Deleted

## 2019-10-04 ENCOUNTER — Telehealth: Payer: Self-pay | Admitting: *Deleted

## 2019-10-04 ENCOUNTER — Ambulatory Visit (INDEPENDENT_AMBULATORY_CARE_PROVIDER_SITE_OTHER): Payer: 59 | Admitting: *Deleted

## 2019-10-04 DIAGNOSIS — G43711 Chronic migraine without aura, intractable, with status migrainosus: Secondary | ICD-10-CM

## 2019-10-04 DIAGNOSIS — Z0289 Encounter for other administrative examinations: Secondary | ICD-10-CM

## 2019-10-04 MED ORDER — AJOVY 225 MG/1.5ML ~~LOC~~ SOSY: 225.0000 mg | PREFILLED_SYRINGE | SUBCUTANEOUS | 0 refills | Status: DC

## 2019-10-04 MED ORDER — OXYCODONE-ACETAMINOPHEN 10-325 MG PO TABS: 1.0000 | ORAL_TABLET | Freq: Four times a day (QID) | ORAL | 0 refills | Status: DC | PRN

## 2019-10-04 MED ORDER — NURTEC 75 MG PO TBDP: 75.0000 mg | ORAL_TABLET | ORAL | 11 refills | Status: DC

## 2019-10-04 NOTE — Progress Notes (Signed)
Pt was provided with Ajovy 225 mg sample syringes x 2 per Dr. Lucia Gaskins. Order in chart. Pt prefers Nurtec over [place]. Ubrelvy tablets are too big and make her sick. She is interested in trying it as preventive. I discussed with Dr. Lucia Gaskins and received the verbal order for Nurtec as prevention (take 1 tab PO QOD)  #15, refills 11. E-scribed to [facility] pharmacy.

## 2019-10-04 NOTE — Telephone Encounter (Signed)
Order placed for two Ajovy syringes per v.o. Dr. Lucia Gaskins.

## 2019-10-08 ENCOUNTER — Telehealth: Payer: Self-pay | Admitting: Neurology

## 2019-10-08 NOTE — Telephone Encounter (Signed)
PA completed for the patient on cover my meds through [facility] for Oxycodone 10/325 mg.  UIV:[id number] Will wait for response.  IF it comes back denied. [facility] coupon where she can get 60 tablets for 11$

## 2019-10-09 NOTE — Telephone Encounter (Signed)
Received respone, "This medication or product is on your plan's list of covered drugs. Prior authorization is not required at this time."

## 2019-10-16 ENCOUNTER — Telehealth: Payer: Self-pay | Admitting: Neurology

## 2019-10-16 NOTE — Telephone Encounter (Signed)
Patient has Botox appointment on 7/15. I called [facility] [phone]) and I spoke with Minerva Areola. We scheduled delivery of patient's Botox for 7/7.

## 2019-10-18 ENCOUNTER — Other Ambulatory Visit: Payer: Self-pay | Admitting: Neurology

## 2019-10-18 MED ORDER — DULOXETINE HCL 30 MG PO CPEP: ORAL_CAPSULE | ORAL | 6 refills | Status: DC

## 2019-10-18 NOTE — Progress Notes (Signed)
cymbalta 

## 2019-10-24 ENCOUNTER — Telehealth: Payer: Self-pay | Admitting: *Deleted

## 2019-10-24 NOTE — Telephone Encounter (Signed)
Completed Duloxetine 30 mg PA on Cover My Meds. KeyMaurilio Lovely - PA Case ID: [id number] - Rx #: [id number]. Awaiting determination from [facility].

## 2019-10-25 NOTE — Telephone Encounter (Signed)
I called [facility] to check on delivery because it was not delivered yesterday as scheduled. I spoke with Michele [name], who states that it must have been oversight on their part. She placed another order to be delivered on 7/13 for patient's 7/15 appointment.

## 2019-10-25 NOTE — Telephone Encounter (Signed)
Per Cover My Meds, Duloxetine 30 mg #60 approved.   Request Reference Number: [id number]. DULOXETINE CAP 30MG  is approved through 10/23/2020. Your patient may now fill this prescription and it will be covered.  I faxed the approval notice to the pt's pharmacy. Received a receipt of confirmation.

## 2019-10-29 ENCOUNTER — Ambulatory Visit: Payer: Self-pay | Admitting: Neurology

## 2019-10-30 NOTE — Telephone Encounter (Signed)
(  2) 100U vials of Botox delivered from [facility] today for patient's 11/01/19 appointment.

## 2019-11-01 ENCOUNTER — Telehealth: Payer: Self-pay | Admitting: Neurology

## 2019-11-01 ENCOUNTER — Ambulatory Visit: Payer: Self-pay | Admitting: Neurology

## 2019-11-01 NOTE — Telephone Encounter (Signed)
Received the fax that was sent over and it was 89 pages. A PA has not been attempted yet on cover my meds. I initiated a PA under the patient's insurance.  PA completed through cover my meds/ [facility].  Will hear a response in 72 hrs. KEY:[id number]

## 2019-11-01 NOTE — Telephone Encounter (Signed)
Michele [name] called from [facility] she needs a PA for  Rimegepant Sulfate (NURTEC) 75 MG TBDP she is going to send to my fax [phone] and I will bring up to you thanks Annabelle Harman.

## 2019-11-01 NOTE — Telephone Encounter (Signed)
I called [employer] to make sure patient's PA will not be expiring soon. [phone]) I spoke with Marcelino Duster and I gave her the codes [phone] and [phone]. She states [phone] will need to be updated. She was able to approve new PA. PA #[id number] (11/01/19- 10/31/20).

## 2019-11-05 NOTE — Telephone Encounter (Signed)
PA was denied because can not be used with another medication in her case Ubrelvy. Will send appeal letter advising that the patient it not taking both together that she has tried and failed the [place]. I have completed appeal letter and will send to [facility] for appeal to be completed. Appears also that her quantity of 15 tablets a month will exceed the amount their plan allows. We will wait to see if that is the case.

## 2019-11-06 ENCOUNTER — Encounter: Payer: Self-pay | Admitting: Neurology

## 2019-11-06 ENCOUNTER — Ambulatory Visit (INDEPENDENT_AMBULATORY_CARE_PROVIDER_SITE_OTHER): Payer: 59 | Admitting: Neurology

## 2019-11-06 ENCOUNTER — Other Ambulatory Visit: Payer: Self-pay

## 2019-11-06 DIAGNOSIS — G43711 Chronic migraine without aura, intractable, with status migrainosus: Secondary | ICD-10-CM | POA: Diagnosis not present

## 2019-11-06 NOTE — Progress Notes (Signed)
Botox- 100 units x 2 vials Lot: [id number] Expiration: 01/2022 NDC: 5366-4403-47  Bacteriostatic 0.9% Sodium Chloride- 15mL total Lot: [id number] Expiration: 11/17/2019 NDC: 87564-332-95  Dx: J88.416 S/P

## 2019-11-06 NOTE — Progress Notes (Signed)
Consent Form Botulism Toxin Injection For Chronic Migraine  Excellent response, hasn't had a migraine in weeks, > 50% improvement or even more.  Reviewed orally with patient, additionally signature is on file:  Botulism toxin has been approved by the Federal drug administration for treatment of chronic migraine. Botulism toxin does not cure chronic migraine and it may not be effective in some patients.  The administration of botulism toxin is accomplished by injecting a small amount of toxin into the muscles of the neck and head. Dosage must be titrated for each individual. Any benefits resulting from botulism toxin tend to wear off after 3 months with a repeat injection required if benefit is to be maintained. Injections are usually done every 3-4 months with maximum effect peak achieved by about 2 or 3 weeks. Botulism toxin is expensive and you should be sure of what costs you will incur resulting from the injection.  The side effects of botulism toxin use for chronic migraine may include:   -Transient, and usually mild, facial weakness with facial injections  -Transient, and usually mild, head or neck weakness with head/neck injections  -Reduction or loss of forehead facial animation due to forehead muscle weakness  -Eyelid drooping  -Dry eye  -Pain at the site of injection or bruising at the site of injection  -Double vision  -Potential unknown long term risks  Contraindications: You should not have Botox if you are pregnant, nursing, allergic to albumin, have an infection, skin condition, or muscle weakness at the site of the injection, or have myasthenia gravis, Lambert-Eaton syndrome, or ALS.  It is also possible that as with any injection, there may be an allergic reaction or no effect from the medication. Reduced effectiveness after repeated injections is sometimes seen and rarely infection at the injection site may occur. All care will be taken to prevent these side effects. If  therapy is given over a long time, atrophy and wasting in the muscle injected may occur. Occasionally the patient's become refractory to treatment because they develop antibodies to the toxin. In this event, therapy needs to be modified.  I have read the above information and consent to the administration of botulism toxin.    BOTOX PROCEDURE NOTE FOR MIGRAINE HEADACHE    Contraindications and precautions discussed with patient(above). Aseptic procedure was observed and patient tolerated procedure. Procedure performed by Dr. Artemio Aly  The condition has existed for more than 6 months, and pt does not have a diagnosis of ALS, Myasthenia Gravis or Lambert-Eaton Syndrome.  Risks and benefits of injections discussed and pt agrees to proceed with the procedure.  Written consent obtained  These injections are medically necessary. Pt  receives good benefits from these injections. These injections do not cause sedations or hallucinations which the oral therapies may cause.  Description of procedure:  The patient was placed in a sitting position. The standard protocol was used for Botox as follows, with 5 units of Botox injected at each site:   -Procerus muscle, midline injection  -Corrugator muscle, bilateral injection  -Frontalis muscle, bilateral injection, with 2 sites each side, medial injection was performed in the upper one third of the frontalis muscle, in the region vertical from the medial inferior edge of the superior orbital rim. The lateral injection was again in the upper one third of the forehead vertically above the lateral limbus of the cornea, 1.5 cm lateral to the medial injection site.  -Temporalis muscle injection, 4 sites, bilaterally. The first injection was 3 cm above  the tragus of the ear, second injection site was 1.5 cm to 3 cm up from the first injection site in line with the tragus of the ear. The third injection site was 1.5-3 cm forward between the first 2 injection  sites. The fourth injection site was 1.5 cm posterior to the second injection site.   -Occipitalis muscle injection, 3 sites, bilaterally. The first injection was done one half way between the occipital protuberance and the tip of the mastoid process behind the ear. The second injection site was done lateral and superior to the first, 1 fingerbreadth from the first injection. The third injection site was 1 fingerbreadth superiorly and medially from the first injection site.  -Cervical paraspinal muscle injection, 2 sites, bilateral knee first injection site was 1 cm from the midline of the cervical spine, 3 cm inferior to the lower border of the occipital protuberance. The second injection site was 1.5 cm superiorly and laterally to the first injection site.  -Trapezius muscle injection was performed at 3 sites, bilaterally. The first injection site was in the upper trapezius muscle halfway between the inflection point of the neck, and the acromion. The second injection site was one half way between the acromion and the first injection site. The third injection was done between the first injection site and the inflection point of the neck.   Will return for repeat injection in 3 months.   200 units of Botox was used, any Botox not injected was wasted. The patient tolerated the procedure well, there were no complications of the above procedure.   I spent 45 minutes of face-to-face and non-face-to-face time with patient on the  No diagnosis found. diagnosis.  This included previsit chart review, lab review, study review, order entry, electronic health record documentation, patient education on the different diagnostic and therapeutic options, counseling and coordination of care, risks and benefits of management, compliance, or risk factor reduction

## 2019-11-06 NOTE — Telephone Encounter (Signed)
Tamiara called from [place] called re: the PA for pt's Nurtec, please call

## 2019-11-07 NOTE — Telephone Encounter (Signed)
I have contacted the [employer] back. They were checking on the status of the Prior auth. Advised that it was denied and appeal letter was sent for the patient and we are waiting for response from the appeal letter.

## 2019-11-26 NOTE — Telephone Encounter (Signed)
[email or web address]  has called re: resubmission denial on file for pt's Nurtec.  The reference Key:[id number] call back# for [facility] Is [phone] Morrie Sheldon stated the resubmission can be submitted by calling [facility] @ [phone] or thru [facility] portal

## 2019-11-27 NOTE — Telephone Encounter (Signed)
Key: [id number]. Completed PA on Cover My Meds. Awaiting determination from [facility].

## 2019-11-29 NOTE — Telephone Encounter (Signed)
Request Reference Number: [id number]. NURTEC TAB 75MG  ODT is approved through 11/26/2020. Your patient may now fill this prescription and it will be covered.   Approval for 15 tablets per 30 days.

## 2020-01-22 ENCOUNTER — Telehealth: Payer: Self-pay | Admitting: Neurology

## 2020-01-22 MED ORDER — NURTEC 75 MG PO TBDP: 75.0000 mg | ORAL_TABLET | ORAL | 11 refills | Status: DC

## 2020-01-22 NOTE — Telephone Encounter (Signed)
Pt called, have nurse tried to get Rimegepant Sulfate (NURTEC) 75 MG TBDP approved for every other day. Would like a call from the nurse.

## 2020-01-22 NOTE — Telephone Encounter (Signed)
We have an approved PA stating 8 per prescription/max 16 per month from [facility]. I called [facility] to discuss. They stated they can fill 8 tablets per 16 days and asked for the Rx to reflect this so it will be in connection with PA approval. I faxed the approval letter to [facility] pharmacy (Received a receipt of confirmation) and adjusted Rx to show quantity of 8 per 16 days. I called the pt and updated her and provided [facility] number to call if she doesn't hear from them by tomorrow. Pt was very appreciative.

## 2020-01-28 ENCOUNTER — Other Ambulatory Visit: Payer: Self-pay | Admitting: Neurology

## 2020-01-28 MED ORDER — LAMOTRIGINE ER 200 MG PO TB24: 200.0000 mg | ORAL_TABLET | Freq: Every day | ORAL | 6 refills | Status: DC

## 2020-02-06 ENCOUNTER — Telehealth: Payer: Self-pay | Admitting: *Deleted

## 2020-02-06 NOTE — Telephone Encounter (Signed)
Lamotrigine XR PA completed on Cover My Meds. Key: [id number]. Awaiting determination from [facility].

## 2020-02-07 NOTE — Telephone Encounter (Signed)
Request Reference Number: [id number]. LAMOTRIGINE TAB 200MG  ER is approved through 02/05/2021. Your patient may now fill this prescription and it will be covered.  I faxed the approval dates to the pharmacy. Received a receipt of confirmation.

## 2020-03-25 ENCOUNTER — Other Ambulatory Visit: Payer: Self-pay | Admitting: Neurology

## 2020-03-26 ENCOUNTER — Other Ambulatory Visit: Payer: Self-pay | Admitting: Neurology

## 2020-03-26 MED ORDER — HYDROCODONE-ACETAMINOPHEN 10-325 MG PO TABS: 1.0000 | ORAL_TABLET | ORAL | 0 refills | Status: DC | PRN

## 2020-04-27 DIAGNOSIS — M48061 Spinal stenosis, lumbar region without neurogenic claudication: Secondary | ICD-10-CM | POA: Insufficient documentation

## 2020-05-01 DIAGNOSIS — E559 Vitamin D deficiency, unspecified: Secondary | ICD-10-CM | POA: Diagnosis not present

## 2020-05-01 DIAGNOSIS — Z79899 Other long term (current) drug therapy: Secondary | ICD-10-CM | POA: Diagnosis not present

## 2020-05-01 DIAGNOSIS — Z0000 Encounter for general adult medical examination without abnormal findings: Secondary | ICD-10-CM | POA: Diagnosis not present

## 2020-05-02 DIAGNOSIS — M5416 Radiculopathy, lumbar region: Secondary | ICD-10-CM | POA: Diagnosis not present

## 2020-05-08 DIAGNOSIS — E559 Vitamin D deficiency, unspecified: Secondary | ICD-10-CM | POA: Diagnosis not present

## 2020-05-08 DIAGNOSIS — K921 Melena: Secondary | ICD-10-CM | POA: Diagnosis not present

## 2020-05-08 DIAGNOSIS — Z1331 Encounter for screening for depression: Secondary | ICD-10-CM | POA: Diagnosis not present

## 2020-05-08 DIAGNOSIS — Z0000 Encounter for general adult medical examination without abnormal findings: Secondary | ICD-10-CM | POA: Diagnosis not present

## 2020-05-08 DIAGNOSIS — R82998 Other abnormal findings in urine: Secondary | ICD-10-CM | POA: Diagnosis not present

## 2020-05-08 LAB — IFOBT (OCCULT BLOOD): IFOBT: POSITIVE

## 2020-05-14 ENCOUNTER — Other Ambulatory Visit: Payer: Self-pay | Admitting: Neurology

## 2020-05-16 ENCOUNTER — Telehealth: Payer: Self-pay | Admitting: Neurology

## 2020-05-16 NOTE — Telephone Encounter (Signed)
Pt called stating that her pharmacy informed her that her ondansetron (ZOFRAN-ODT) 4 MG disintegrating tablet refill has not been approved as of yet and the pt was wanting to know when this will be called in for her. Pt was informed that the office is closed and they will receive the message on Monday. Pt verbalized understanding. Please advise.

## 2020-05-19 MED ORDER — ONDANSETRON 4 MG PO TBDP: 4.0000 mg | ORAL_TABLET | Freq: Three times a day (TID) | ORAL | 11 refills | Status: DC | PRN

## 2020-05-19 NOTE — Telephone Encounter (Signed)
I did not see any pending requests for the Zofran. Spoke with Dr Lucia Gaskins, ok to send in 12 refills. Done.

## 2020-06-16 ENCOUNTER — Other Ambulatory Visit: Payer: Self-pay

## 2020-06-16 ENCOUNTER — Ambulatory Visit: Payer: BC Managed Care – PPO | Attending: Internal Medicine

## 2020-06-16 DIAGNOSIS — G8929 Other chronic pain: Secondary | ICD-10-CM | POA: Diagnosis not present

## 2020-06-16 DIAGNOSIS — R252 Cramp and spasm: Secondary | ICD-10-CM | POA: Insufficient documentation

## 2020-06-16 DIAGNOSIS — M542 Cervicalgia: Secondary | ICD-10-CM | POA: Insufficient documentation

## 2020-06-16 DIAGNOSIS — M545 Low back pain, unspecified: Secondary | ICD-10-CM | POA: Insufficient documentation

## 2020-06-16 NOTE — Therapy (Signed)
[facility] [address], [address] [place], [place], [address] Phone: [phone]   Fax:  [phone]  Physical Therapy Evaluation  Patient Details  Name: Michele BeachLisa M [name] MRN: [id number] Date of Birth: 11/25/1955 Referring Provider (Michele [name]): Rodrigo RanPerini, Mark, MD   Encounter Date: 06/16/2020   Michele [name] End of Session - 06/16/20 1441    Visit Number 1    Date for Michele [name] Re-Evaluation 08/11/20    Authorization Type BCBS    Michele [name] Start Time 1358    Michele [name] Stop Time 1443    Michele [name] Time Calculation (min) 45 min    Activity Tolerance Patient tolerated treatment well    Behavior [facility] assessed/performed           Past Medical History:  Diagnosis Date  . Anxiety   . Chronic headaches   . Dyspareunia in female   . Greater trochanteric bursitis    bilateral  . Hypersomnia   . Migraines   . Vitamin D deficiency    in the past per Michele [name]    Past Surgical History:  Procedure Laterality Date  . COLONOSCOPY    . endoscopy    . LAPAROSCOPIC TOTAL HYSTERECTOMY    . WISDOM TOOTH EXTRACTION      There were no vitals filed for this visit.    Subjective Assessment - 06/16/20 1402    Subjective I slipped going up the steps in the Fall 2021 with multiple contusions.  I am now having low back pain that is "on fire".  Michele [name] went to urgent care at [facility] recently.  X-ray showed scoliosis and spondylosis.  Michele [name] had 1 therapy session at [facility] and didn't return.    Pertinent History chronic migraines,    Limitations Sitting;Standing;Walking    How long can you sit comfortably? 30 minutes    How long can you stand comfortably? 15 min max    Diagnostic tests x-ray: lumbar scoliosis and DDD    Patient Stated Goals reduce LBP, sit/stand longer    Currently in Pain? Yes    Pain Score 8    5-6/10 in low back, 8/10 in Rt glute   Pain Location Back    Pain Orientation Right    Pain Descriptors / Indicators Burning    Pain Type Chronic pain     Pain Radiating Towards Rt gluteals    Pain Onset More than a month ago    Pain Frequency Constant    Aggravating Factors  bending, sit to stand, lifting/moving objects, standing (early in the morning)    Pain Relieving Factors ice/heat, change of postion              [name] Assessment - 06/16/20 0001      Assessment   Medical Diagnosis other spondylosis, lumbar region    Referring Provider (Michele [name]) Rodrigo RanPerini, Mark, MD    Next MD Visit none    Prior Therapy at this clinic      Precautions   Precautions None      Restrictions   Weight Bearing Restrictions No      Balance Screen   Has the patient fallen in the past 6 months Yes    How many times? 1   on steps- no general balance issues   Has the patient had a decrease in activity level because of a fear of falling?  No    Is the patient reluctant to leave their home because of a fear of falling?  No      Home Environment   Living Environment Private residence    Living Arrangements Spouse/significant other    Type of Home House    Home Access Stairs to enter    Home Equipment None      Prior Function   Level of Independence Independent    Vocation Full time employment    Leisure gardening, walking      Cognition   Overall Cognitive Status Within Functional Limits for tasks assessed      Observation/Other Assessments   Focus on Therapeutic Outcomes (FOTO)  40 (goal is 60)      Posture/Postural Control   Posture/Postural Control No significant limitations      ROM / Strength   AROM / PROM / Strength AROM;PROM;Strength      AROM   Overall AROM  Deficits    Overall AROM Comments lumbar A/ROM limited by 25% with Rt gluteal/lumbar pain with Rt sidebending and forward flexion      PROM   Overall PROM  Within functional limits for tasks performed    Overall PROM Comments hip flexiblity is WFLs with Rt gluteal and lumbar pain with passive motion and overpressure on Rt      Strength   Overall Strength Deficits     Overall Strength Comments Rt hip and knee 4/5, lt hip and knee 4+5      Palpation   Spinal mobility reduced mobility in lumbar spinal segments- pain L2-5    Palpation comment tension and trigger points in Rt proximal gluteals and Rt>Lt lumbar paraspinals      Special Tests    Special Tests Lumbar    Lumbar Tests Slump Test;Straight Leg Raise      Slump test   Findings Negative    Side Right      Straight Leg Raise   Findings Negative    Side  Right      Transfers   Transfers Sit to Stand;Stand to Sit    Sit to Stand 6: Modified independent (Device/Increase time)    Stand to Sit 6: Modified independent (Device/Increase time)      Ambulation/Gait   Ambulation/Gait Yes    Gait Pattern Within Functional Limits                      Objective measurements completed on examination: See above findings.               Michele [name] Education - 06/16/20 1431    Education Details Access Code: [email or web address], home TENs info    Person(s) Educated Patient    Methods Explanation;Demonstration;Handout    Comprehension Verbalized understanding;Returned demonstration            Michele [name] Short Term Goals - 06/16/20 1420      Michele [name] SHORT TERM GOAL #1   Title be independent in initial HEP    Time 4    Period Weeks    Status New    Target Date 07/14/20      Michele [name] SHORT TERM GOAL #2   Title report a 25% reduction in LBP with housework and yardwork    Baseline --    Time 4    Period Weeks    Status New    Target Date 07/14/20      Michele [name] SHORT TERM GOAL #3   Title reduce LBP to allow for standing and walking > or = to 25 minutes without limitation    Time 4  Period Weeks    Status New    Target Date 07/14/20             Michele [name] Long Term Goals - 06/16/20 1443      Michele [name] LONG TERM GOAL #1   Title be independent in advanced HEP    Time 8    Period Weeks    Status New    Target Date 08/11/20      Michele [name] LONG TERM GOAL #2   Title improve FOTO to > or = to 60 to improve function     Period Weeks    Status New    Target Date 08/11/20      Michele [name] LONG TERM GOAL #3   Title reduce LBP to allow for sitting at work > or = to 45 minutes to 1 hour without limitation    Time 8    Period Weeks    Status New    Target Date 08/11/20      Michele [name] LONG TERM GOAL #4   Title report a 60% reduction in LBP with yardwork and standing tasks    Time 8    Period Weeks    Status New    Target Date 08/11/20      Michele [name] LONG TERM GOAL #5   Title demonstrate and verbalize understanding of body mechanics modifications with lifting and housework/yardwork    Time 8    Period Weeks    Status New    Target Date 08/11/20                  Plan - 06/16/20 1448    Clinical Impression Statement Michele [name] presents to Michele [name] with complaints of chronic LBP and Rt gluteal pain that began 3-4 months ago after a fall when walking up the steps when she landed on her Rt side.   Recent x-ray showed DDD in the lumbar spine and scoliosis.  Michele [name] rates pain as 5-6/10 in the low back and 8/10 in Rt gluteals and pain increases with standing, walking, lifting and carrying objects and sitting > 30 mintues.   Sitting is limited to 30 minutes at work and standing/walking limited to 15 minutes.   Lumbar A/ROM is limited by 25% with Rt lumbar pain with flexion and Rt sidebending.  Hip A/ROM is WNL with Rt gluteal pain at end range with overpressure.  Michele [name] with tension and trigger points in bil lumbar paraspinals Rt>Lt , trigger points in Rt proximal gluteals and reduced segmental mobility in the lumbar spine with pain reported L2-5.  Michele [name] will benefit from skilled Michele [name] for core and hip strength, flexibility, manual and body mechanics education.    Personal Factors and Comorbidities Comorbidity 1    Comorbidities history of migraines    Examination-Activity Limitations Lift;Locomotion Level;Sit;Squat;Stairs;Stand;Transfers    Examination-Participation Restrictions Community Activity;Laundry;Yard Work;Shop    Stability/Clinical Decision Making  Stable/Uncomplicated    Clinical Decision Making Low    Rehab Potential Good    Michele [name] Frequency 2x / week    Michele [name] Duration 8 weeks    Michele [name] Treatment/Interventions ADLs/Self Care Home Management;Cryotherapy;Electrical Stimulation;Moist Heat;Traction;Functional mobility training;Therapeutic activities;Therapeutic exercise;Balance training;Neuromuscular re-education;Patient/family education;Manual techniques;Passive range of motion;Dry needling;Taping;Spinal Manipulations;Joint Manipulations    Michele [name] Next Visit Plan Dry needling to lumbar multifidi and Rt gluteals, core strength, body mechanics education    Michele [name] Home Exercise Plan [email or web address]    Consulted and Agree with Plan of Care Patient           Patient will benefit from skilled  therapeutic intervention in order to improve the following deficits and impairments:  Decreased activity tolerance,Decreased strength,Impaired flexibility,Hypomobility,Pain,Decreased endurance,Increased muscle spasms,Decreased range of motion  Visit Diagnosis: Chronic bilateral low back pain without sciatica - Plan: Michele [name] plan of care cert/re-cert  Cramp and spasm - Plan: Michele [name] plan of care cert/re-cert  Cervicalgia - Plan: Michele [name] plan of care cert/re-cert     Problem List Patient Active Problem List   Diagnosis Date Noted  . Chronic migraine without aura, with intractable migraine, so stated, with status migrainosus 07/24/2018     Michele [name], Michele [name] 06/16/20 3:42 PM  [facility] [address], [address] [place], [place], [address] Phone: [phone]   Fax:  [phone]  Name: Michele [name] MRN: [id number] Date of Birth: 1956-02-06

## 2020-06-16 NOTE — Patient Instructions (Signed)
Access Code: [email or web address] URL: [email or web address] Date: 06/16/2020 Prepared by: Tresa Endo  Exercises Supine Lower Trunk Rotation - 1 x daily - 7 x weekly - 3 reps - 1 sets - 20 hold Hooklying Single Knee to Chest - 3 x daily - 7 x weekly - 3 reps - 1 sets - 20 hold Seated Hamstring Stretch - 3 x daily - 7 x weekly - 3 reps - 1 sets - 20 hold Seated Piriformis Stretch with Trunk Bend - 3 x daily - 7 x weekly - 3 reps - 1 sets - 20 hold

## 2020-06-23 ENCOUNTER — Other Ambulatory Visit: Payer: Self-pay

## 2020-06-23 ENCOUNTER — Ambulatory Visit: Payer: BC Managed Care – PPO | Attending: Internal Medicine

## 2020-06-23 DIAGNOSIS — M542 Cervicalgia: Secondary | ICD-10-CM | POA: Insufficient documentation

## 2020-06-23 DIAGNOSIS — R252 Cramp and spasm: Secondary | ICD-10-CM | POA: Insufficient documentation

## 2020-06-23 DIAGNOSIS — G8929 Other chronic pain: Secondary | ICD-10-CM | POA: Insufficient documentation

## 2020-06-23 DIAGNOSIS — M545 Low back pain, unspecified: Secondary | ICD-10-CM | POA: Diagnosis not present

## 2020-06-23 NOTE — Therapy (Signed)
[facility] [address] [place], [place], [address] Phone: [phone]   Fax:  [phone]  Physical Therapy Treatment  Patient Details  Name: Michele [name] MRN: [id number] Date of Birth: 1955/12/29 Referring Provider (PT): Rodrigo Ran, MD   Encounter Date: 06/23/2020   PT End of Session - 06/23/20 1610    Visit Number 2    Date for PT Re-Evaluation 08/11/20    Authorization Type BCBS    PT Start Time 1530    PT Stop Time 1612    PT Time Calculation (min) 42 min           Past Medical History:  Diagnosis Date  . Anxiety   . Chronic headaches   . Dyspareunia in female   . Greater trochanteric bursitis    bilateral  . Hypersomnia   . Migraines   . Vitamin D deficiency    in the past per pt    Past Surgical History:  Procedure Laterality Date  . COLONOSCOPY    . endoscopy    . LAPAROSCOPIC TOTAL HYSTERECTOMY    . WISDOM TOOTH EXTRACTION      There were no vitals filed for this visit.   Subjective Assessment - 06/23/20 1536    Subjective Patient reports pain to be 8.5/10 upon immediately waking up. Has been partially compliant with HEP.    Currently in Pain? Yes    Pain Score 7     Pain Location Back    Pain Orientation Right    Pain Descriptors / Indicators Burning    Pain Onset More than a month ago    Pain Frequency Constant    Aggravating Factors  bending, sit to stand    Pain Relieving Factors ice/heat, massager                             [facility] Adult PT Treatment/Exercise - 06/23/20 0001      Manual Therapy   Manual Therapy Soft tissue mobilization    Manual therapy comments bil lumbar paraspinals and gluteals    Soft tissue mobilization soft tissue elongation and trigger point release; passive stretch to R QL, and glutes            Trigger Point Dry Needling - 06/23/20 0001    Consent Given? Yes   previously provided   Education Handout Provided Previously  provided    Muscles Treated Back/Hip Gluteus minimus;Gluteus medius;Gluteus maximus;Piriformis;Erector spinae;Lumbar multifidi    Gluteus Minimus Response Twitch response elicited;Palpable increased muscle length    Gluteus Medius Response Twitch response elicited;Palpable increased muscle length    Gluteus Maximus Response Twitch response elicited;Palpable increased muscle length    Piriformis Response Twitch response elicited;Palpable increased muscle length    Erector spinae Response Twitch response elicited;Palpable increased muscle length    Lumbar multifidi Response Twitch response elicited;Palpable increased muscle length    Quadratus Lumborum Response Twitch response elicited;Palpable increased muscle length   Rt only               PT Education - 06/23/20 1609    Education Details compliance with HEP; seated QL stretch    Person(s) Educated Patient    Methods Explanation;Demonstration    Comprehension Verbalized understanding;Returned demonstration            PT Short Term Goals - 06/16/20 1420      PT SHORT TERM GOAL #1   Title be independent  in initial HEP    Time 4    Period Weeks    Status New    Target Date 07/14/20      PT SHORT TERM GOAL #2   Title report a 25% reduction in LBP with housework and yardwork    Baseline --    Time 4    Period Weeks    Status New    Target Date 07/14/20      PT SHORT TERM GOAL #3   Title reduce LBP to allow for standing and walking > or = to 25 minutes without limitation    Time 4    Period Weeks    Status New    Target Date 07/14/20             PT Long Term Goals - 06/16/20 1443      PT LONG TERM GOAL #1   Title be independent in advanced HEP    Time 8    Period Weeks    Status New    Target Date 08/11/20      PT LONG TERM GOAL #2   Title improve FOTO to > or = to 60 to improve function    Period Weeks    Status New    Target Date 08/11/20      PT LONG TERM GOAL #3   Title reduce LBP to allow for  sitting at work > or = to 45 minutes to 1 hour without limitation    Time 8    Period Weeks    Status New    Target Date 08/11/20      PT LONG TERM GOAL #4   Title report a 60% reduction in LBP with yardwork and standing tasks    Time 8    Period Weeks    Status New    Target Date 08/11/20      PT LONG TERM GOAL #5   Title demonstrate and verbalize understanding of body mechanics modifications with lifting and housework/yardwork    Time 8    Period Weeks    Status New    Target Date 08/11/20                 Plan - 06/23/20 1644    Clinical Impression Statement Pt with first time follow up today.  Pt arrived with 7-8/10 lumbar and Rt gluteal pain and session focused on verbal review of HEP and manual therapy to address trigger points and tissue tension.  Pt with trigger points and good twitch response in Rt gluteals, bil lumbar paraspinals and Rt quadratus.  Pt reported 5-6/10 pain post-session. PT added quadratus stretch to HEP.  Pt will continue to benefit from skilled PT to address lumbar and Rt gluteal pain.    Rehab Potential Good    PT Frequency 2x / week    PT Duration 8 weeks    PT Treatment/Interventions ADLs/Self Care Home Management;Cryotherapy;Electrical Stimulation;Moist Heat;Traction;Functional mobility training;Therapeutic activities;Therapeutic exercise;Balance training;Neuromuscular re-education;Patient/family education;Manual techniques;Passive range of motion;Dry needling;Taping;Spinal Manipulations;Joint Manipulations    PT Next Visit Plan assess response to dry needling, review HEP, add to HEP for core strength.    PT Home Exercise Plan [email or web address]    Consulted and Agree with Plan of Care Patient           Patient will benefit from skilled therapeutic intervention in order to improve the following deficits and impairments:  Decreased activity tolerance,Decreased strength,Impaired flexibility,Hypomobility,Pain,Decreased endurance,Increased muscle  spasms,Decreased range of motion  Visit Diagnosis:  Chronic bilateral low back pain without sciatica  Cramp and spasm     Problem List Patient Active Problem List   Diagnosis Date Noted  . Chronic migraine without aura, with intractable migraine, so stated, with status migrainosus 07/24/2018    Lorrene Reid, PT 06/23/20 4:49 PM  [facility] [address] [place], [place], [address] Phone: [phone]   Fax:  [phone]  Name: Michele [name] MRN: [id number] Date of Birth: Nov 27, 1955

## 2020-06-26 ENCOUNTER — Ambulatory Visit: Payer: BC Managed Care – PPO

## 2020-06-26 ENCOUNTER — Other Ambulatory Visit: Payer: Self-pay

## 2020-06-26 DIAGNOSIS — M542 Cervicalgia: Secondary | ICD-10-CM | POA: Diagnosis not present

## 2020-06-26 DIAGNOSIS — G8929 Other chronic pain: Secondary | ICD-10-CM | POA: Diagnosis not present

## 2020-06-26 DIAGNOSIS — M545 Low back pain, unspecified: Secondary | ICD-10-CM | POA: Diagnosis not present

## 2020-06-26 DIAGNOSIS — R252 Cramp and spasm: Secondary | ICD-10-CM

## 2020-06-26 NOTE — Therapy (Signed)
[facility] [address], [address] [place], [place], [address] Phone: [phone]   Fax:  [phone]  Physical Therapy Treatment  Patient Details  Name: Michele [name] MRN: [id number] Date of Birth: 02/02/1956 Referring Provider (PT): Rodrigo Ran, MD   Encounter Date: 06/26/2020   PT End of Session - 06/26/20 0821    Visit Number 3    Date for PT Re-Evaluation 08/11/20    Authorization Type BCBS    PT Start Time 0732    PT Stop Time 0814    PT Time Calculation (min) 42 min    Activity Tolerance Patient tolerated treatment well    Behavior During Therapy [facility] for tasks assessed/performed           Past Medical History:  Diagnosis Date  . Anxiety   . Chronic headaches   . Dyspareunia in female   . Greater trochanteric bursitis    bilateral  . Hypersomnia   . Migraines   . Vitamin D deficiency    in the past per pt    Past Surgical History:  Procedure Laterality Date  . COLONOSCOPY    . endoscopy    . LAPAROSCOPIC TOTAL HYSTERECTOMY    . WISDOM TOOTH EXTRACTION      There were no vitals filed for this visit.   Subjective Assessment - 06/26/20 0737    Subjective I am feeling a little better. 6/10 lumbar and gluteal pain today.    Currently in Pain? Yes    Pain Score 6     Pain Location Back    Pain Orientation Right    Pain Descriptors / Indicators Burning;Aching    Pain Type Chronic pain    Pain Onset More than a month ago    Pain Frequency Constant    Aggravating Factors  bending, sit to stand    Pain Relieving Factors ice/heat, massager                             [facility] Adult PT Treatment/Exercise - 06/26/20 0001      Exercises   Exercises Lumbar;Knee/Hip      Lumbar Exercises: Stretches   Active Hamstring Stretch 2 reps;20 seconds    Single Knee to Chest Stretch 2 reps;20 seconds    Lower Trunk Rotation 2 reps;20 seconds      Lumbar Exercises: Supine   Ab Set 5 reps     AB Set Limitations 5 second hold      Lumbar Exercises: Sidelying   Clam Both;20 reps    Clam Limitations with ab bracing      Knee/Hip Exercises: Stretches   Active Hamstring Stretch Both;2 reps;20 seconds    Hip Flexor Stretch 2 reps;Both;20 seconds      Manual Therapy   Manual Therapy Soft tissue mobilization    Manual therapy comments bil lumbar paraspinals and gluteals    Soft tissue mobilization soft tissue elongation and trigger point release; passive stretch to R QL, and glutes            Trigger Point Dry Needling - 06/26/20 0001    Consent Given? Yes   previously provided   Education Handout Provided Previously provided    Muscles Treated Back/Hip Gluteus minimus;Gluteus medius;Gluteus maximus;Piriformis;Erector spinae;Lumbar multifidi    Gluteus Minimus Response Twitch response elicited;Palpable increased muscle length    Gluteus Medius Response Twitch response elicited;Palpable increased muscle length    Gluteus Maximus Response Twitch  response elicited;Palpable increased muscle length    Piriformis Response Twitch response elicited;Palpable increased muscle length    Erector spinae Response Twitch response elicited;Palpable increased muscle length    Lumbar multifidi Response Twitch response elicited;Palpable increased muscle length                PT Education - 06/26/20 0746    Education Details Access Code: [email or web address]    Person(s) Educated Patient    Methods Explanation;Demonstration;Handout    Comprehension Verbalized understanding;Returned demonstration            PT Short Term Goals - 06/16/20 1420      PT SHORT TERM GOAL #1   Title be independent in initial HEP    Time 4    Period Weeks    Status New    Target Date 07/14/20      PT SHORT TERM GOAL #2   Title report a 25% reduction in LBP with housework and yardwork    Baseline --    Time 4    Period Weeks    Status New    Target Date 07/14/20      PT SHORT TERM GOAL #3   Title reduce  LBP to allow for standing and walking > or = to 25 minutes without limitation    Time 4    Period Weeks    Status New    Target Date 07/14/20             PT Long Term Goals - 06/16/20 1443      PT LONG TERM GOAL #1   Title be independent in advanced HEP    Time 8    Period Weeks    Status New    Target Date 08/11/20      PT LONG TERM GOAL #2   Title improve FOTO to > or = to 60 to improve function    Period Weeks    Status New    Target Date 08/11/20      PT LONG TERM GOAL #3   Title reduce LBP to allow for sitting at work > or = to 45 minutes to 1 hour without limitation    Time 8    Period Weeks    Status New    Target Date 08/11/20      PT LONG TERM GOAL #4   Title report a 60% reduction in LBP with yardwork and standing tasks    Time 8    Period Weeks    Status New    Target Date 08/11/20      PT LONG TERM GOAL #5   Title demonstrate and verbalize understanding of body mechanics modifications with lifting and housework/yardwork    Time 8    Period Weeks    Status New    Target Date 08/11/20                 Plan - 06/26/20 0749    Clinical Impression Statement Pt with improved pain overall since last session.  Pt is performing all HEP correctly with good form and PT added Transverse activation and sidelying clamshell to HEP.  PT provided verbal and tactile cues for alignment with clam.  Pt with trigger points in Rt>Lt lumbar paraspinals and Rt>Lt gluteals with good twitch response.  Pt reported reduced pain and improved mobility after manual therapy today.  Pt will continue to benefit from skilled PT to address remaining deficits.    PT Frequency 2x / week  PT Duration 8 weeks    PT Treatment/Interventions ADLs/Self Care Home Management;Cryotherapy;Electrical Stimulation;Moist Heat;Traction;Functional mobility training;Therapeutic activities;Therapeutic exercise;Balance training;Neuromuscular re-education;Patient/family education;Manual  techniques;Passive range of motion;Dry needling;Taping;Spinal Manipulations;Joint Manipulations    PT Next Visit Plan assess response to dry needling, review HEP    PT Home Exercise Plan [email or web address]    Consulted and Agree with Plan of Care Patient           Patient will benefit from skilled therapeutic intervention in order to improve the following deficits and impairments:  Decreased activity tolerance,Decreased strength,Impaired flexibility,Hypomobility,Pain,Decreased endurance,Increased muscle spasms,Decreased range of motion  Visit Diagnosis: Cramp and spasm  Chronic bilateral low back pain without sciatica  Cervicalgia     Problem List Patient Active Problem List   Diagnosis Date Noted  . Chronic migraine without aura, with intractable migraine, so stated, with status migrainosus 07/24/2018    Lorrene Reid, PT 06/26/20 8:25 AM  [facility] [address] [place], [place], [address] Phone: [phone]   Fax:  [phone]  Name: Michele [name] MRN: [id number] Date of Birth: [birth year]

## 2020-06-26 NOTE — Patient Instructions (Signed)
Access Code: [email or web address] URL: [email or web address] Date: 06/26/2020 Prepared by: Tresa Endo  Exercises  Supine Transversus Abdominis Bracing - Hands on Stomach - 1 x daily - 7 x weekly - 3 sets - 10 reps Seated Transversus Abdominis Bracing - 1 x daily - 7 x weekly - 3 sets - 10 reps Clamshell - 2 x daily - 7 x weekly - 2 sets - 10 reps

## 2020-07-03 ENCOUNTER — Ambulatory Visit: Payer: BC Managed Care – PPO

## 2020-07-08 ENCOUNTER — Other Ambulatory Visit: Payer: Self-pay

## 2020-07-08 ENCOUNTER — Encounter: Payer: Self-pay | Admitting: Gastroenterology

## 2020-07-08 ENCOUNTER — Other Ambulatory Visit: Payer: Self-pay | Admitting: Gastroenterology

## 2020-07-08 ENCOUNTER — Ambulatory Visit: Payer: BC Managed Care – PPO | Admitting: Gastroenterology

## 2020-07-08 VITALS — BP 119/77 | HR 65

## 2020-07-08 DIAGNOSIS — R195 Other fecal abnormalities: Secondary | ICD-10-CM

## 2020-07-08 DIAGNOSIS — R109 Unspecified abdominal pain: Secondary | ICD-10-CM

## 2020-07-08 DIAGNOSIS — Z791 Long term (current) use of non-steroidal anti-inflammatories (NSAID): Secondary | ICD-10-CM | POA: Diagnosis not present

## 2020-07-08 DIAGNOSIS — R1013 Epigastric pain: Secondary | ICD-10-CM | POA: Diagnosis not present

## 2020-07-08 MED ORDER — PLENVU 140 G PO SOLR: ORAL | 0 refills | Status: DC

## 2020-07-08 MED ORDER — DICYCLOMINE HCL 10 MG PO CAPS: 10.0000 mg | ORAL_CAPSULE | Freq: Three times a day (TID) | ORAL | 0 refills | Status: DC

## 2020-07-08 MED ORDER — SUCRALFATE 1 G PO TABS: 1.0000 g | ORAL_TABLET | Freq: Three times a day (TID) | ORAL | 0 refills | Status: DC

## 2020-07-08 NOTE — Patient Instructions (Signed)
We will send Carafate and Dicyclomine to your pharmacy  Continue Zegerid OTC daily as needed  Limit use of NSAIDS  You have been scheduled for an endoscopy and colonoscopy. Please follow the written instructions given to you at your visit today. Please pick up your prep supplies at the pharmacy within the next 1-3 days. If you use inhalers (even only as needed), please bring them with you on the day of your procedure.   Due to recent changes in healthcare laws, you may see the results of your imaging and laboratory studies on MyChart before your provider has had a chance to review them.  We understand that in some cases there may be results that are confusing or concerning to you. Not all laboratory results come back in the same time frame and the provider may be waiting for multiple results in order to interpret others.  Please give [place] 48 hours in order for your provider to thoroughly review all the results before contacting the office for clarification of your results.   If you are age 10 or older, your body mass index should be between 23-30. Your There is no height or weight on file to calculate BMI. If this is out of the aforementioned range listed, please consider follow up with your Primary Care Provider.  If you are age 36 or younger, your body mass index should be between 19-25. Your There is no height or weight on file to calculate BMI. If this is out of the aformentioned range listed, please consider follow up with your Primary Care Provider.    I appreciate the  opportunity to care for you  Thank You   Marsa Aris , MD

## 2020-07-08 NOTE — Progress Notes (Signed)
Michele [name]    [id number]    1955/07/14  Primary Care Physician:Perini, Loraine Leriche, MD  Referring Physician: Rodrigo Ran, MD [address] [place],  [place] [address]   Chief complaint: Blood in stool  HPI:  65 year old very pleasant female here for evaluation of positive fecal Hemoccult.  She is a psychiatrist currently practicing [name]  Denies any overt GI bleeding, melena or rectal bleeding.  She has occasional GERD, worse when she eats spicy food, improves with Zegerid over-the-counter as needed.  She fell off the stairs in garage last fall, she went to urgent care, X- ray showed stenosis, did PT and has been taking NSAIDs.   She takes 2 Aleve per day along with tylenol, almost daily  Last week she had a episode of diarrhea and since then she has been having stomach upset whenever she tries to eat anything. She has contant stomach ache, also has decreased appetite.  No melena, vomiting, fever or any sick contacts.   Outpatient Encounter Medications as of 07/08/2020  Medication Sig  . acetaminophen-codeine (TYLENOL #3) 300-30 MG tablet Take 1-2 tablets by mouth every 4 (four) hours as needed for moderate pain.  Marland Kitchen ALPRAZolam (XANAX) 1 MG tablet Take 0.5 mg by mouth as needed for anxiety.  . Armodafinil 250 MG tablet Take 125 mg by mouth 2 (two) times daily.  Marland Kitchen atenolol (TENORMIN) 100 MG tablet Take 1 tablet (100 mg total) by mouth daily. (Patient taking differently: Take 50 mg by mouth daily.)  . baclofen (LIORESAL) 10 MG tablet Take 0.5-1 tablets (5-10 mg total) by mouth 3 (three) times daily as needed for muscle spasms. (Patient taking differently: Take 5-10 mg by mouth daily as needed for muscle spasms.)  . botulinum toxin Type A (BOTOX) 100 units SOLR injection INJECT 155 UNITS  INTRAMUSCULARLY INTO  HEAD/NECK EVERY 3 MONTHS  (GIVEN AT MD OFFICE,  DISCARD UNUSED)  . citalopram (CELEXA) 40 MG tablet Take 1 tablet (40 mg total) by mouth daily.  . DULoxetine  (CYMBALTA) 30 MG capsule Take one pill daily and then can increase to 2 pills daily in 10 days  . EFFEXOR XR 150 MG 24 hr capsule Take 1 capsule (150 mg total) by mouth daily with breakfast.  . estradiol (VIVELLE-DOT) 0.05 MG/24HR patch Place 1 patch onto the skin 2 (two) times a week.  . Fremanezumab-vfrm (AJOVY) 225 MG/1.5ML SOSY Inject 225 mg into the skin every 30 (thirty) days.  Marland Kitchen HYDROcodone-acetaminophen (NORCO) 10-325 MG tablet Take 1 tablet by mouth every 4 (four) hours as needed for severe pain.  . LamoTRIgine (LAMICTAL XR) 200 MG TB24 24 hour tablet Take 1 tablet (200 mg total) by mouth at bedtime.  . methylPREDNISolone (MEDROL DOSEPAK) 4 MG TBPK tablet Take pills together each morning with food for 6 days  . metoCLOPramide (REGLAN) 10 MG tablet Take 1 tablet (10 mg total) by mouth every 6 (six) hours as needed for nausea.  . ondansetron (ZOFRAN) 4 MG tablet Take 4 mg by mouth as needed for nausea or vomiting.  . ondansetron (ZOFRAN-ODT) 4 MG disintegrating tablet Take 1 tablet (4 mg total) by mouth every 8 (eight) hours as needed for nausea.  . Rimegepant Sulfate (NURTEC) 75 MG TBDP Take 75 mg by mouth daily as needed (migraine).  . Rimegepant Sulfate (NURTEC) 75 MG TBDP Take 75 mg by mouth every other day.  . traMADol (ULTRAM) 50 MG tablet  Take 50 mg by mouth as needed (pain).  . Ubrogepant (UBRELVY) 100 MG TABS Take 100 mg by mouth every 2 (two) hours as needed. Maximum 200mg  a day.   No facility-administered encounter medications on file as of 07/08/2020.    Allergies as of 07/08/2020  . (No Known Allergies)    Past Medical History:  Diagnosis Date  . Anxiety   . Chronic headaches   . Dyspareunia in female   . Greater trochanteric bursitis    bilateral  . Hypersomnia   . Migraines   . Vitamin D deficiency    in the past per pt    Past Surgical History:  Procedure Laterality Date  . COLONOSCOPY    . endoscopy    . LAPAROSCOPIC TOTAL HYSTERECTOMY    . WISDOM TOOTH  EXTRACTION      Family History  Problem Relation Age of Onset  . Lung cancer Father   . Headache Father   . Other Sister        ureter tumor, smoking related  . Allergic Disorder Brother   . Anxiety disorder Brother     Social History   Socioeconomic History  . Marital status: Married    Spouse name: Not on file  . Number of children: 2  . Years of education: Not on file  . Highest education level: Master's degree (e.g., MA, MS, MEng, MEd, MSW, MBA)  Occupational History  . Not on file  Tobacco Use  . Smoking status: Never Smoker  . Smokeless tobacco: Never Used  Vaping Use  . Vaping Use: Never used  Substance and Sexual Activity  . Alcohol use: Never  . Drug use: Never  . Sexual activity: Not on file  Other Topics Concern  . Not on file  Social History Narrative   Lives at home with spouse   Right handed   Caffeine: 2 cups daily    Social Determinants of Health   Financial Resource Strain: Not on file  Food Insecurity: Not on file  Transportation Needs: Not on file  Physical Activity: Not on file  Stress: Not on file  Social Connections: Not on file  Intimate Partner Violence: Not on file      Review of systems: All other review of systems negative except as mentioned in the HPI.   Physical Exam: Vitals:   07/08/20 1355  BP: 119/77  Pulse: 65  SpO2: 99%   There is no height or weight on file to calculate BMI. Gen:      No acute distress HEENT:  sclera anicteric Abd:      soft, non-tender; no palpable masses, no distension Ext:    No edema Neuro: alert and oriented x 3 Psych: normal mood and affect  Data Reviewed:  Reviewed labs, radiology imaging, old records and pertinent past GI work up   Assessment and Plan/Recommendations:  65 year old very pleasant female with positive fecal Hemoccult Complains of epigastric abdominal discomfort and decreased appetite with history of chronic NSAID use We will schedule for EGD and colonoscopy to  exclude severe gastritis, peptic ulcer disease or neoplastic lesion in the stomach or colon The risks and benefits as well as alternatives of endoscopic procedure(s) have been discussed and reviewed. All questions answered. The patient agrees to proceed.  Use Zegerid daily as needed for GERD symptoms Carafate 1 g before meals as needed Dicyclomine 10 mg every 8 hours as needed for severe abdominal cramping and discomfort Small frequent meals  Further recommendation based on findings  on EGD and colonoscopy  The risks and benefits as well as alternatives of endoscopic procedure(s) have been discussed and reviewed. All questions answered. The patient agrees to proceed.  The patient was provided an opportunity to ask questions and all were answered. The patient agreed with the plan and demonstrated an understanding of the instructions.  Iona Beard , MD    CC: Rodrigo Ran, MD

## 2020-07-08 NOTE — Telephone Encounter (Signed)
Sent in 90 day supply of carafate to pharmacy as requested

## 2020-07-16 ENCOUNTER — Other Ambulatory Visit: Payer: Self-pay

## 2020-07-16 ENCOUNTER — Ambulatory Visit: Payer: BC Managed Care – PPO

## 2020-07-16 DIAGNOSIS — R252 Cramp and spasm: Secondary | ICD-10-CM | POA: Diagnosis not present

## 2020-07-16 DIAGNOSIS — G8929 Other chronic pain: Secondary | ICD-10-CM | POA: Diagnosis not present

## 2020-07-16 DIAGNOSIS — M542 Cervicalgia: Secondary | ICD-10-CM | POA: Diagnosis not present

## 2020-07-16 DIAGNOSIS — M545 Low back pain, unspecified: Secondary | ICD-10-CM

## 2020-07-16 NOTE — Therapy (Signed)
[facility] [address], [address] [place], [place], [address] Phone: [phone]   Fax:  [phone]  Physical Therapy Treatment  Patient Details  Name: Michele [name] MRN: [id number] Date of Birth: 10/04/1955 Referring Provider (PT): Rodrigo Ran, MD   Encounter Date: 07/16/2020   PT End of Session - 07/16/20 1654    Visit Number 4    Date for PT Re-Evaluation 08/11/20    Authorization Type BCBS    PT Start Time 1616    PT Stop Time 1655   DN   PT Time Calculation (min) 39 min    Activity Tolerance Patient tolerated treatment well    Behavior During Therapy [facility] for tasks assessed/performed           Past Medical History:  Diagnosis Date  . Anxiety   . Chronic headaches   . Dyspareunia in female   . Greater trochanteric bursitis    bilateral  . Hypersomnia   . Migraines   . Vitamin D deficiency    in the past per pt    Past Surgical History:  Procedure Laterality Date  . COLONOSCOPY    . endoscopy    . LAPAROSCOPIC TOTAL HYSTERECTOMY    . UPPER GASTROINTESTINAL ENDOSCOPY    . WISDOM TOOTH EXTRACTION      There were no vitals filed for this visit.   Subjective Assessment - 07/16/20 1616    Subjective Things were bad on Saturday.  I had to rest all day.  "I'm sick of it".    Currently in Pain? Yes    Pain Score 4    up to 10/10 in the past week   Pain Location Back    Pain Orientation Right    Pain Descriptors / Indicators Aching;Burning    Pain Type Chronic pain    Pain Onset More than a month ago    Pain Frequency Constant    Aggravating Factors  bending, sit to stand, sometimes it is just random    Pain Relieving Factors ice/heat, massager                             [facility] Adult PT Treatment/Exercise - 07/16/20 0001      Manual Therapy   Manual Therapy Soft tissue mobilization    Manual therapy comments bil lumbar paraspinals and gluteals, Rt quadratus    Soft tissue  mobilization soft tissue elongation and trigger point release; passive stretch to R QL, and glutes            Trigger Point Dry Needling - 07/16/20 0001    Consent Given? Yes    Education Handout Provided Previously provided    Muscles Treated Head and Neck Upper trapezius   Rt only   Muscles Treated Back/Hip Gluteus minimus;Gluteus medius;Gluteus maximus;Piriformis;Erector spinae;Lumbar multifidi    Upper Trapezius Response Twitch reponse elicited;Palpable increased muscle length    Gluteus Minimus Response Twitch response elicited;Palpable increased muscle length    Gluteus Medius Response Twitch response elicited;Palpable increased muscle length    Gluteus Maximus Response Twitch response elicited;Palpable increased muscle length    Piriformis Response Twitch response elicited;Palpable increased muscle length    Lumbar multifidi Response Twitch response elicited;Palpable increased muscle length    Quadratus Lumborum Response Twitch response elicited;Palpable increased muscle length                  PT Short Term Goals - 07/16/20 1622  PT SHORT TERM GOAL #1   Title be independent in initial HEP    Status Achieved      PT SHORT TERM GOAL #2   Title report a 25% reduction in LBP with housework and yardwork    Status On-going      PT SHORT TERM GOAL #3   Title reduce LBP to allow for standing and walking > or = to 25 minutes without limitation    Baseline this depends on the day    Status On-going             PT Long Term Goals - 06/16/20 1443      PT LONG TERM GOAL #1   Title be independent in advanced HEP    Time 8    Period Weeks    Status New    Target Date 08/11/20      PT LONG TERM GOAL #2   Title improve FOTO to > or = to 60 to improve function    Period Weeks    Status New    Target Date 08/11/20      PT LONG TERM GOAL #3   Title reduce LBP to allow for sitting at work > or = to 45 minutes to 1 hour without limitation    Time 8    Period  Weeks    Status New    Target Date 08/11/20      PT LONG TERM GOAL #4   Title report a 60% reduction in LBP with yardwork and standing tasks    Time 8    Period Weeks    Status New    Target Date 08/11/20      PT LONG TERM GOAL #5   Title demonstrate and verbalize understanding of body mechanics modifications with lifting and housework/yardwork    Time 8    Period Weeks    Status New    Target Date 08/11/20                 Plan - 07/16/20 1624    Clinical Impression Statement Pt reports that pain has been bad since last session.  Pt reports 8-10/10 LBP within the past week.  Ability to walk is dependent on pain level and this varies.  Pt is performing all HEP and is adding transverse abdominus activation during her drive to work.  Pt reports  Pt with trigger points in Rt>Lt lumbar paraspinals, Rt quadratus and Rt>Lt gluteals with good twitch response.  Pt reported reduced pain and improved mobility after manual therapy today.  Pt will continue to benefit from skilled PT to address remaining deficits.    PT Frequency 2x / week    PT Duration 8 weeks    PT Treatment/Interventions ADLs/Self Care Home Management;Cryotherapy;Electrical Stimulation;Moist Heat;Traction;Functional mobility training;Therapeutic activities;Therapeutic exercise;Balance training;Neuromuscular re-education;Patient/family education;Manual techniques;Passive range of motion;Dry needling;Taping;Spinal Manipulations;Joint Manipulations    PT Next Visit Plan assess response to dry needling, review HEP, advance activity as tolerated    PT Home Exercise Plan [email or web address]    Consulted and Agree with Plan of Care Patient           Patient will benefit from skilled therapeutic intervention in order to improve the following deficits and impairments:  Decreased activity tolerance,Decreased strength,Impaired flexibility,Hypomobility,Pain,Decreased endurance,Increased muscle spasms,Decreased range of motion  Visit  Diagnosis: Cramp and spasm  Chronic bilateral low back pain without sciatica  Cervicalgia     Problem List Patient Active Problem List   Diagnosis Date Noted  .  Chronic migraine without aura, with intractable migraine, so stated, with status migrainosus 07/24/2018     Lorrene Reid, PT 07/16/20 4:56 PM  [facility] [address] [place], [place], [address] Phone: [phone]   Fax:  [phone]  Name: CHARLE MCLAURIN MRN: [id number] Date of Birth: 02-Jul-1955

## 2020-07-21 ENCOUNTER — Ambulatory Visit: Payer: BC Managed Care – PPO

## 2020-07-23 ENCOUNTER — Other Ambulatory Visit: Payer: Self-pay

## 2020-07-23 ENCOUNTER — Ambulatory Visit: Payer: BC Managed Care – PPO | Attending: Internal Medicine

## 2020-07-23 DIAGNOSIS — R252 Cramp and spasm: Secondary | ICD-10-CM

## 2020-07-23 DIAGNOSIS — M545 Low back pain, unspecified: Secondary | ICD-10-CM | POA: Diagnosis not present

## 2020-07-23 DIAGNOSIS — G8929 Other chronic pain: Secondary | ICD-10-CM

## 2020-07-23 DIAGNOSIS — M542 Cervicalgia: Secondary | ICD-10-CM

## 2020-07-23 NOTE — Therapy (Signed)
[facility] [address] [place], [place], [address] Phone: [phone]   Fax:  [phone]  Physical Therapy Treatment  Patient Details  Name: Michele [name] MRN: [id number] Date of Birth: 1955/10/17 Referring Provider (PT): Crist Infante, MD   Encounter Date: 07/23/2020   PT End of Session - 07/23/20 1648    Visit Number 5    Date for PT Re-Evaluation 08/11/20    Authorization Type BCBS    PT Start Time 1616    PT Stop Time 1650    PT Time Calculation (min) 34 min    Activity Tolerance Patient tolerated treatment well    Behavior During Therapy [facility] for tasks assessed/performed           Past Medical History:  Diagnosis Date  . Anxiety   . Chronic headaches   . Dyspareunia in female   . Greater trochanteric bursitis    bilateral  . Hypersomnia   . Migraines   . Vitamin D deficiency    in the past per pt    Past Surgical History:  Procedure Laterality Date  . COLONOSCOPY    . endoscopy    . LAPAROSCOPIC TOTAL HYSTERECTOMY    . UPPER GASTROINTESTINAL ENDOSCOPY    . WISDOM TOOTH EXTRACTION      There were no vitals filed for this visit.   Subjective Assessment - 07/23/20 1613    Subjective Things were bad this morning.  Now my pain is better.  I was sick on Monday so my hips were sore from being in bed.    Patient Stated Goals reduce LBP, sit/stand longer    Currently in Pain? Yes    Pain Score 5     Pain Location Back    Pain Orientation Right;Left;Lower    Pain Descriptors / Indicators Aching;Burning    Pain Type Chronic pain    Pain Onset More than a month ago    Pain Frequency Constant    Aggravating Factors  it is random, lying on side    Pain Relieving Factors ice/heat, massager                             [facility] Adult PT Treatment/Exercise - 07/23/20 0001      Manual Therapy   Manual Therapy Soft tissue mobilization    Manual therapy comments bil lumbar paraspinals  and gluteals, Rt quadratus    Soft tissue mobilization soft tissue elongation and trigger point release; passive stretch to R QL, and glutes            Trigger Point Dry Needling - 07/23/20 0001    Consent Given? Yes    Education Handout Provided Previously provided    Muscles Treated Head and Neck Upper trapezius   Rt only   Muscles Treated Back/Hip Gluteus minimus;Gluteus medius;Gluteus maximus;Piriformis;Erector spinae;Lumbar multifidi    Upper Trapezius Response Twitch reponse elicited;Palpable increased muscle length    Gluteus Minimus Response Twitch response elicited;Palpable increased muscle length    Gluteus Medius Response Twitch response elicited;Palpable increased muscle length    Gluteus Maximus Response Twitch response elicited;Palpable increased muscle length    Piriformis Response Twitch response elicited;Palpable increased muscle length    Lumbar multifidi Response Twitch response elicited;Palpable increased muscle length                PT Education - 07/23/20 1648    Education Details verbal review of all HEP  Person(s) Educated Patient    Methods Explanation    Comprehension Verbalized understanding            PT Short Term Goals - 07/23/20 1620      PT SHORT TERM GOAL #1   Title be independent in initial HEP    Status Achieved      PT SHORT TERM GOAL #2   Title report a 25% reduction in LBP with housework and yardwork    Baseline 50% less frequent    Status Achieved      PT SHORT TERM GOAL #3   Title reduce LBP to allow for standing and walking > or = to 25 minutes without limitation    Baseline 30    Status Achieved             PT Long Term Goals - 06/16/20 1443      PT LONG TERM GOAL #1   Title be independent in advanced HEP    Time 8    Period Weeks    Status New    Target Date 08/11/20      PT LONG TERM GOAL #2   Title improve FOTO to > or = to 60 to improve function    Period Weeks    Status New    Target Date 08/11/20       PT LONG TERM GOAL #3   Title reduce LBP to allow for sitting at work > or = to 45 minutes to 1 hour without limitation    Time 8    Period Weeks    Status New    Target Date 08/11/20      PT LONG TERM GOAL #4   Title report a 60% reduction in LBP with yardwork and standing tasks    Time 8    Period Weeks    Status New    Target Date 08/11/20      PT LONG TERM GOAL #5   Title demonstrate and verbalize understanding of body mechanics modifications with lifting and housework/yardwork    Time 8    Period Weeks    Status New    Target Date 08/11/20                 Plan - 07/23/20 1623    Clinical Impression Statement Pt reports 50% reduction in the frequency of LBP and still experiences pain up to 8/10 at times.  Pt is able to walk for 30 minutes on level surface now.  STGs met. Pt is performing all HEP and is adding transverse abdominus activation during her drive to work.  P Pt with trigger points in Rt>Lt lumbar paraspinals, Rt quadratus and Rt>Lt gluteals with good twitch response.  Pt reported reduced pain and improved mobility after manual therapy today.  Pt will continue to benefit from skilled PT to address remaining deficits.    PT Frequency 2x / week    PT Duration 8 weeks    PT Treatment/Interventions ADLs/Self Care Home Management;Cryotherapy;Electrical Stimulation;Moist Heat;Traction;Functional mobility training;Therapeutic activities;Therapeutic exercise;Balance training;Neuromuscular re-education;Patient/family education;Manual techniques;Passive range of motion;Dry needling;Taping;Spinal Manipulations;Joint Manipulations    PT Next Visit Plan assess response to dry needling, advance activity as tolerated    PT Home Exercise Plan [email or web address]    Consulted and Agree with Plan of Care Patient           Patient will benefit from skilled therapeutic intervention in order to improve the following deficits and impairments:  Decreased activity tolerance,Decreased  strength,Impaired flexibility,Hypomobility,Pain,Decreased  endurance,Increased muscle spasms,Decreased range of motion  Visit Diagnosis: Chronic bilateral low back pain without sciatica  Cramp and spasm  Cervicalgia     Problem List Patient Active Problem List   Diagnosis Date Noted  . Chronic migraine without aura, with intractable migraine, so stated, with status migrainosus 07/24/2018     Sigurd Sos, PT 07/23/20 4:50 PM  [facility] [address] [place], [place], [address] Phone: [phone]   Fax:  [phone]  Name: Michele [name] MRN: [id number] Date of Birth: February 12, 1956

## 2020-08-13 ENCOUNTER — Ambulatory Visit: Payer: BC Managed Care – PPO

## 2020-08-13 ENCOUNTER — Other Ambulatory Visit: Payer: Self-pay

## 2020-08-13 DIAGNOSIS — M545 Low back pain, unspecified: Secondary | ICD-10-CM

## 2020-08-13 DIAGNOSIS — R252 Cramp and spasm: Secondary | ICD-10-CM | POA: Diagnosis not present

## 2020-08-13 DIAGNOSIS — G8929 Other chronic pain: Secondary | ICD-10-CM | POA: Diagnosis not present

## 2020-08-13 DIAGNOSIS — M542 Cervicalgia: Secondary | ICD-10-CM

## 2020-08-13 NOTE — Therapy (Signed)
[facility] [address], [place], [place], [address] Phone: [phone]   Fax:  [phone]  Physical Therapy Treatment  Patient Details  Name: Michele [name] MRN: [id number] Date of Birth: 1955-07-15 Referring Provider (PT): Crist Infante, MD   Encounter Date: 08/13/2020   PT End of Session - 08/13/20 1659    Visit Number 6    PT Start Time 1618    PT Stop Time 1648    PT Time Calculation (min) 30 min    Activity Tolerance Patient tolerated treatment well    Behavior During Therapy [facility] for tasks assessed/performed           Past Medical History:  Diagnosis Date  . Anxiety   . Chronic headaches   . Dyspareunia in female   . Greater trochanteric bursitis    bilateral  . Hypersomnia   . Migraines   . Vitamin D deficiency    in the past per pt    Past Surgical History:  Procedure Laterality Date  . COLONOSCOPY    . endoscopy    . LAPAROSCOPIC TOTAL HYSTERECTOMY    . UPPER GASTROINTESTINAL ENDOSCOPY    . WISDOM TOOTH EXTRACTION      There were no vitals filed for this visit.   Subjective Assessment - 08/13/20 1620    Subjective I've been traveling so I haven't been here.  I'm really tired.  My back is still aching.    Currently in Pain? Yes    Pain Score 8     Pain Location Back    Pain Orientation Right    Pain Descriptors / Indicators Aching;Burning    Pain Type Chronic pain    Pain Onset More than a month ago    Pain Frequency Constant    Aggravating Factors  it is random, lying on side    Pain Relieving Factors ice/heat, massager              [facility] PT Assessment - 08/13/20 0001      Assessment   Medical Diagnosis other spondylosis, lumbar region    Referring Provider (PT) Crist Infante, MD      Prior Function   Level of Independence Independent      Cognition   Overall Cognitive Status Within Functional Limits for tasks assessed                         [facility]  Adult PT Treatment/Exercise - 08/13/20 0001      Manual Therapy   Manual Therapy Soft tissue mobilization    Soft tissue mobilization soft tissue elongation and trigger point release to Rt quadratus and gluteals            Trigger Point Dry Needling - 08/13/20 0001    Consent Given? Yes    Muscles Treated Back/Hip Gluteus minimus;Gluteus medius;Gluteus maximus;Piriformis;Quadratus lumborum   Rt side only   Gluteus Minimus Response Twitch response elicited;Palpable increased muscle length    Gluteus Medius Response Twitch response elicited;Palpable increased muscle length    Gluteus Maximus Response Twitch response elicited;Palpable increased muscle length    Quadratus Lumborum Response Twitch response elicited;Palpable increased muscle length                  PT Short Term Goals - 07/23/20 1620      PT SHORT TERM GOAL #1   Title be independent in initial HEP    Status Achieved  PT SHORT TERM GOAL #2   Title report a 25% reduction in LBP with housework and yardwork    Baseline 50% less frequent    Status Achieved      PT SHORT TERM GOAL #3   Title reduce LBP to allow for standing and walking > or = to 25 minutes without limitation    Baseline 30    Status Achieved             PT Long Term Goals - 08/13/20 1623      PT LONG TERM GOAL #1   Title be independent in advanced HEP    Status Achieved      PT LONG TERM GOAL #2   Title improve FOTO to > or = to 60 to improve function    Baseline 33    Status Not Met      PT LONG TERM GOAL #3   Title reduce LBP to allow for sitting at work > or = to 45 minutes to 1 hour without limitation    Status Not Met      PT LONG TERM GOAL #4   Title report a 60% reduction in LBP with yardwork and standing tasks    Status On-going      PT LONG TERM GOAL #5   Title demonstrate and verbalize understanding of body mechanics modifications with lifting and housework/yardwork    Status Achieved                  Plan - 08/13/20 1625    Clinical Impression Statement Pt with lapse in treatment since 07/23/20.  Pt has been traveling.  Pt arrives with 8/10 Rt gluteal and lumbar pain.  Pt reports that her pain is random and sometimes very intense.  Pt has not made progress toward goals and will D/C to see MD.  Pt with trigger points in Rt gluteals and quadratus and responsed with multiple twitch responses with dry needling.    Rehab Potential --    PT Frequency --    PT Treatment/Interventions --    PT Next Visit Plan Plan D/C to HEP    PT Home Exercise Plan [id number]    Consulted and Agree with Plan of Care Patient           Patient will benefit from skilled therapeutic intervention in order to improve the following deficits and impairments:     Visit Diagnosis: Chronic bilateral low back pain without sciatica - Plan: PT plan of care cert/re-cert  Cramp and spasm - Plan: PT plan of care cert/re-cert  Cervicalgia - Plan: PT plan of care cert/re-cert     Problem List Patient Active Problem List   Diagnosis Date Noted  . Chronic migraine without aura, with intractable migraine, so stated, with status migrainosus 07/24/2018  PHYSICAL THERAPY DISCHARGE SUMMARY  Visits from Start of Care: 6  Current functional level related to goals / functional outcomes: See above for current status.  Pt with continued pain that has not improved with PT.     Remaining deficits: See above goal status.     Education / Equipment: HEP Plan: Patient agrees to discharge.  Patient goals were not met. Patient is being discharged due to lack of progress.  ?????        Sigurd Sos, PT 08/13/20 5:02 PM  [facility] [address], [place], [place], [address] Phone: [phone]   Fax:  [phone]  Name: Michele [name]  MRN: [id number] Date of Birth: 1955-10-25

## 2020-08-30 ENCOUNTER — Other Ambulatory Visit: Payer: Self-pay | Admitting: Neurology

## 2020-08-30 MED ORDER — ONDANSETRON 4 MG PO TBDP: 4.0000 mg | ORAL_TABLET | Freq: Three times a day (TID) | ORAL | 11 refills | Status: DC | PRN

## 2020-08-30 MED ORDER — LAMOTRIGINE ER 200 MG PO TB24: 200.0000 mg | ORAL_TABLET | Freq: Every day | ORAL | 4 refills | Status: DC

## 2020-08-30 MED ORDER — NURTEC 75 MG PO TBDP: 75.0000 mg | ORAL_TABLET | Freq: Every day | ORAL | 11 refills | Status: DC | PRN

## 2020-08-30 MED ORDER — ATENOLOL 50 MG PO TABS: 50.0000 mg | ORAL_TABLET | Freq: Every day | ORAL | 4 refills | Status: DC

## 2020-08-30 MED ORDER — HYDROCODONE-ACETAMINOPHEN 10-325 MG PO TABS: 1.0000 | ORAL_TABLET | ORAL | 0 refills | Status: DC | PRN

## 2020-09-02 ENCOUNTER — Other Ambulatory Visit: Payer: Self-pay | Admitting: Neurology

## 2020-09-03 NOTE — Telephone Encounter (Signed)
Nurtec PA completed on Cover My Meds. Key: [id number]. Awaiting determination. Per Dr Lucia Gaskins, pt still loves the Nurtec.

## 2020-09-04 NOTE — Telephone Encounter (Signed)
Per Cover My Meds, Nurtec approved. Effective from 09/03/2020 through 11/25/2020. Initial episodic Approved for 15 tablets per 30 days.

## 2020-09-06 ENCOUNTER — Other Ambulatory Visit: Payer: Self-pay | Admitting: Neurology

## 2020-09-06 MED ORDER — LAMICTAL 100 MG PO TABS: 100.0000 mg | ORAL_TABLET | Freq: Two times a day (BID) | ORAL | 3 refills | Status: DC

## 2020-09-08 ENCOUNTER — Telehealth: Payer: Self-pay | Admitting: Gastroenterology

## 2020-09-08 MED ORDER — PLENVU 140 G PO SOLR: ORAL | 0 refills | Status: DC

## 2020-09-08 NOTE — Telephone Encounter (Signed)
Prep was Plenvu.  Sent again to patients pharmacy electronically

## 2020-09-08 NOTE — Telephone Encounter (Signed)
This pt never picked up there prep for colonoscopy. Can you send send prescription over again so pt can picked it up. Thank you

## 2020-09-22 ENCOUNTER — Telehealth: Payer: Self-pay | Admitting: *Deleted

## 2020-09-22 ENCOUNTER — Encounter: Payer: Self-pay | Admitting: Gastroenterology

## 2020-09-22 ENCOUNTER — Other Ambulatory Visit: Payer: Self-pay

## 2020-09-22 ENCOUNTER — Ambulatory Visit (AMBULATORY_SURGERY_CENTER): Payer: BC Managed Care – PPO | Admitting: Gastroenterology

## 2020-09-22 ENCOUNTER — Encounter: Payer: Self-pay | Admitting: *Deleted

## 2020-09-22 VITALS — BP 102/53 | HR 67 | Temp 97.2°F | Resp 18 | Ht 66.0 in | Wt 117.0 lb

## 2020-09-22 DIAGNOSIS — K6289 Other specified diseases of anus and rectum: Secondary | ICD-10-CM | POA: Diagnosis not present

## 2020-09-22 DIAGNOSIS — R195 Other fecal abnormalities: Secondary | ICD-10-CM | POA: Diagnosis not present

## 2020-09-22 DIAGNOSIS — R1013 Epigastric pain: Secondary | ICD-10-CM

## 2020-09-22 DIAGNOSIS — K2990 Gastroduodenitis, unspecified, without bleeding: Secondary | ICD-10-CM | POA: Diagnosis not present

## 2020-09-22 DIAGNOSIS — K2970 Gastritis, unspecified, without bleeding: Secondary | ICD-10-CM

## 2020-09-22 DIAGNOSIS — K648 Other hemorrhoids: Secondary | ICD-10-CM | POA: Diagnosis not present

## 2020-09-22 DIAGNOSIS — K319 Disease of stomach and duodenum, unspecified: Secondary | ICD-10-CM | POA: Diagnosis not present

## 2020-09-22 DIAGNOSIS — K219 Gastro-esophageal reflux disease without esophagitis: Secondary | ICD-10-CM | POA: Insufficient documentation

## 2020-09-22 MED ORDER — PANTOPRAZOLE SODIUM 40 MG PO TBEC: 40.0000 mg | DELAYED_RELEASE_TABLET | Freq: Every day | ORAL | 1 refills | Status: DC

## 2020-09-22 MED ORDER — SODIUM CHLORIDE 0.9 % IV SOLN: 500.0000 mL | Freq: Once | INTRAVENOUS | Status: DC

## 2020-09-22 NOTE — Progress Notes (Signed)
Called to room to assist during endoscopic procedure.  Patient ID and intended procedure confirmed with present staff. Received instructions for my participation in the procedure from the performing physician.  

## 2020-09-22 NOTE — Progress Notes (Signed)
Repeat colonoscopy and Pv scheduled for pt by Idelle Jo ,RN

## 2020-09-22 NOTE — Progress Notes (Signed)
Check-in-jb  Vital signs-cw 

## 2020-09-22 NOTE — Op Note (Signed)
[facility] Patient Name: Michele [name] Procedure Date: 09/22/2020 10:37 AM MRN: [id number] Endoscopist: Napoleon Form , MD Age: 65 Referring MD:  Date of Birth: Oct 03, 1955 Gender: Female Account #: [id number] Procedure:                Upper GI endoscopy Indications:              Gastrointestinal bleeding of unknown origin. Heme                            positive stool Medicines:                Monitored Anesthesia Care Procedure:                Pre-Anesthesia Assessment:                           - Prior to the procedure, a History and Physical                            was performed, and patient medications and                            allergies were reviewed. The patient's tolerance of                            previous anesthesia was also reviewed. The risks                            and benefits of the procedure and the sedation                            options and risks were discussed with the patient.                            All questions were answered, and informed consent                            was obtained. Prior Anticoagulants: The patient has                            taken no previous anticoagulant or antiplatelet                            agents. ASA Grade Assessment: II - A patient with                            mild systemic disease. After reviewing the risks                            and benefits, the patient was deemed in                            satisfactory condition to undergo the procedure.  After obtaining informed consent, the endoscope was                            passed under direct vision. Throughout the                            procedure, the patient's blood pressure, pulse, and                            oxygen saturations were monitored continuously. The                            Endoscope was introduced through the mouth, and                            advanced to the second part of  duodenum. The upper                            GI endoscopy was accomplished without difficulty.                            The patient tolerated the procedure well. Scope In: Scope Out: Findings:                 The Z-line was regular and was found 37 cm from the                            incisors.                           The examined esophagus was normal.                           Patchy moderate inflammation characterized by                            congestion (edema), erosions, erythema, linear                            erosions and shallow ulcerations was found in the                            gastric antrum and in the prepyloric region of the                            stomach. Biopsies were taken with a cold forceps                            for histology. Biopsies were taken with a cold                            forceps for Helicobacter pylori testing.                           The examined duodenum  was normal. Complications:            No immediate complications. Estimated Blood Loss:     Estimated blood loss was minimal. Impression:               - Z-line regular, 37 cm from the incisors.                           - Normal esophagus.                           - Gastritis. Biopsied.                           - Normal examined duodenum. Recommendation:           - Patient has a contact number available for                            emergencies. The signs and symptoms of potential                            delayed complications were discussed with the                            patient. Return to normal activities tomorrow.                            Written discharge instructions were provided to the                            patient.                           - Resume previous diet.                           - Continue present medications.                           - Await pathology results.                           - No aspirin, ibuprofen, naproxen, or other                             non-steroidal anti-inflammatory drugs.                           - Use Protonix (pantoprazole) 40 mg PO daily for 3                            months. Napoleon Form, MD 09/22/2020 11:15:05 AM This report has been signed electronically.

## 2020-09-22 NOTE — Progress Notes (Signed)
pt tolerated well. VSS. awake and to recovery. Report given to RN. Bite blcok left insitu to recovery.

## 2020-09-22 NOTE — Op Note (Signed)
[facility] Patient Name: Michele [name] Procedure Date: 09/22/2020 10:36 AM MRN: [id number] Endoscopist: Napoleon Form , MD Age: 65 Referring MD:  Date of Birth: 1955/11/07 Gender: Female Account #: [id number] Procedure:                Colonoscopy Indications:              Evaluation of unexplained GI bleeding presenting                            with fecal occult blood Medicines:                Monitored Anesthesia Care Procedure:                Pre-Anesthesia Assessment:                           - Prior to the procedure, a History and Physical                            was performed, and patient medications and                            allergies were reviewed. The patient's tolerance of                            previous anesthesia was also reviewed. The risks                            and benefits of the procedure and the sedation                            options and risks were discussed with the patient.                            All questions were answered, and informed consent                            was obtained. Prior Anticoagulants: The patient has                            taken no previous anticoagulant or antiplatelet                            agents. ASA Grade Assessment: II - A patient with                            mild systemic disease. After reviewing the risks                            and benefits, the patient was deemed in                            satisfactory condition to undergo the procedure.  After obtaining informed consent, the colonoscope                            was passed under direct vision. Throughout the                            procedure, the patient's blood pressure, pulse, and                            oxygen saturations were monitored continuously. The                            Olympus PFC-[id number] (#[id number]) Colonoscope was                            introduced through the anus and  advanced to the the                            cecum, identified by appendiceal orifice and                            ileocecal valve. The colonoscopy was technically                            difficult and complex due to inadequate bowel prep.                            The patient tolerated the procedure well. The                            quality of the bowel preparation was inadequate and                            30 percent obscured. The ileocecal valve,                            appendiceal orifice, and rectum were photographed. Scope In: 10:57:09 AM Scope Out: 11:10:47 AM Scope Withdrawal Time: 0 hours 4 minutes 8 seconds  Total Procedure Duration: 0 hours 13 minutes 38 seconds  Findings:                 The digital rectal exam findings include decreased                            sphincter tone.                           Mild rectal prolapse was present.                           A moderate amount of stool was found in the                            ascending colon and in the cecum, interfering with  visualization. Lavage of the area was performed,                            resulting in incomplete clearance with continued                            poor visualization.                           Non-bleeding external and internal hemorrhoids were                            found during retroflexion. The hemorrhoids were                            small. Complications:            No immediate complications. Estimated Blood Loss:     Estimated blood loss was minimal. Impression:               - Preparation of the colon was inadequate.                           - Decreased sphincter tone found on digital rectal                            exam.                           - Rectal prolapse.                           - Stool in the ascending colon and in the cecum.                           - Non-bleeding external and internal hemorrhoids.                            - No specimens collected. Recommendation:           - Patient has a contact number available for                            emergencies. The signs and symptoms of potential                            delayed complications were discussed with the                            patient. Return to normal activities tomorrow.                            Written discharge instructions were provided to the                            patient.                           -  Resume previous diet.                           - Continue present medications.                           - Repeat colonoscopy at the next available                            appointment because the bowel preparation was                            suboptimal. Napoleon FormKavitha V. Angelicia Lessner, MD 09/22/2020 11:19:00 AM This report has been signed electronically.

## 2020-09-22 NOTE — Patient Instructions (Addendum)
Colonoscopy:  A REPEAT COLONOSCOPY SCHEDULED DUE TO INADEQUATE PREP - SEE BELOW FOR DATES AND TIME    Handout on hemorrhoids given to you today   Upper Endoscopy:  HANDOUT ON GASTRITIS GIVEN TO YOU TODAY  AWAIT BIOPSY RESULTS FROM STOMACH    PROTONIX ORDER SENT TO YOUR PHARMACY - SEE BELOW   YOU HAD AN ENDOSCOPIC PROCEDURE TODAY AT THE [facility]:   Refer to the procedure report that was given to you for any specific questions about what was found during the examination.  If the procedure report does not answer your questions, please call your gastroenterologist to clarify.  If you requested that your care partner not be given the details of your procedure findings, then the procedure report has been included in a sealed envelope for you to review at your convenience later.  YOU SHOULD EXPECT: Some feelings of bloating in the abdomen. Passage of more gas than usual.  Walking can help get rid of the air that was put into your GI tract during the procedure and reduce the bloating. If you had a lower endoscopy (such as a colonoscopy or flexible sigmoidoscopy) you may notice spotting of blood in your stool or on the toilet paper. If you underwent a bowel prep for your procedure, you may not have a normal bowel movement for a few days.  Please Note:  You might notice some irritation and congestion in your nose or some drainage.  This is from the oxygen used during your procedure.  There is no need for concern and it should clear up in a day or so.  SYMPTOMS TO REPORT IMMEDIATELY:   Following lower endoscopy (colonoscopy or flexible sigmoidoscopy):  Excessive amounts of blood in the stool  Significant tenderness or worsening of abdominal pains  Swelling of the abdomen that is new, acute  Fever of 100F or higher   Following upper endoscopy (EGD)  Vomiting of blood or coffee ground material  New chest pain or pain under the shoulder blades  Painful or persistently  difficult swallowing  New shortness of breath  Fever of 100F or higher  Black, tarry-looking stools  For urgent or emergent issues, a gastroenterologist can be reached at any hour by calling [phone]. Do not use MyChart messaging for urgent concerns.    DIET:  We do recommend a small meal at first, but then you may proceed to your regular diet.  Drink plenty of fluids but you should avoid alcoholic beverages for 24 hours.  ACTIVITY:  You should plan to take it easy for the rest of today and you should NOT DRIVE or use heavy machinery until tomorrow (because of the sedation medicines used during the test).    FOLLOW UP: Our staff will call the number listed on your records 48-72 hours following your procedure to check on you and address any questions or concerns that you may have regarding the information given to you following your procedure. If we do not reach you, we will leave a message.  We will attempt to reach you two times.  During this call, we will ask if you have developed any symptoms of COVID 19. If you develop any symptoms (ie: fever, flu-like symptoms, shortness of breath, cough etc.) before then, please call [phone].  If you test positive for Covid 19 in the 2 weeks post procedure, please call and report this information to [place].    If any biopsies were taken you will be contacted by phone or  by letter within the next 1-3 weeks.  Please call us at [phone] if you have not heard about the biopsies in 3 weeks.    SIGNATURES/CONFIDENTIALITY: You and/or your care partner have signed paperwork which will be entered into your electronic medical record.  These signatures attest to the fact that that the information above on your After Visit Summary has been reviewed and is understood.  Full responsibility of the confidentiality of this discharge information lies with you and/or your care-partner

## 2020-09-22 NOTE — Telephone Encounter (Signed)
I done a prior authorization request through Cover My Meds today for Protonix. Waiting on a response

## 2020-09-24 ENCOUNTER — Telehealth: Payer: Self-pay | Admitting: *Deleted

## 2020-09-24 MED ORDER — ESOMEPRAZOLE MAGNESIUM 40 MG PO CPDR: DELAYED_RELEASE_CAPSULE | ORAL | 1 refills | Status: DC

## 2020-09-24 NOTE — Telephone Encounter (Signed)
No answer for post procedure call back. Left message for patient to call with questions or concerns. 

## 2020-09-24 NOTE — Telephone Encounter (Signed)
Left message on f/u call 

## 2020-09-24 NOTE — Telephone Encounter (Signed)
Ok to switch to Nexium 40mg  daily X 90 tabs with 1 refill. Thanks

## 2020-09-24 NOTE — Telephone Encounter (Signed)
Pantoprazole denied Pt must try Nexium first   Dr Lavon Paganini please advise if ok to send Nexium 40 mg once daily for the patient in place of Protonix

## 2020-09-24 NOTE — Telephone Encounter (Signed)
Left message for patient that I will be sending in Nexium 40 mg in for her to try.

## 2020-09-25 ENCOUNTER — Encounter: Payer: Self-pay | Admitting: Gastroenterology

## 2020-10-06 ENCOUNTER — Ambulatory Visit: Payer: BC Managed Care – PPO | Admitting: Neurology

## 2020-10-06 DIAGNOSIS — G43001 Migraine without aura, not intractable, with status migrainosus: Secondary | ICD-10-CM | POA: Diagnosis not present

## 2020-10-06 DIAGNOSIS — R519 Headache, unspecified: Secondary | ICD-10-CM | POA: Diagnosis not present

## 2020-10-06 NOTE — Progress Notes (Signed)
Nerve block w/o) steroid: Pt signed consent-yes  0.5% Bupivocaine 9 mL LOT: [id number] EXP: 09/22 NDC: 47829-562-13  2% Lidocaine 9 mL LOT: [id number] EXP: 11/05/2020 NDC: 62952-841-32

## 2020-10-06 NOTE — Progress Notes (Signed)
Severe headache, intractable over 2 days, 20/10 in pain, she has tried multiple medications without improvement, performing nerve blocks today   Performed by Dr. Barry Dienes.D.30-gauge needle was used. All procedures a documented below were medically necessary, reasonable and appropriate based on the patient's history, medical diagnosis and physician opinion. Verbal informed consent was obtained from the patient, patient was informed of potential risk of procedure, including bruising, bleeding, hematoma formation, infection, muscle weakness, muscle pain, numbness, transient hypertension, transient hyperglycemia and transient insomnia among others. All areas injected were topically clean with isopropyl rubbing alcohol. Nonsterile nonlatex gloves were worn during the procedure.  1. Greater occipital nerve block [phone]). The greater occipital nerve site was identified at the nuchal line medial to the occipital artery. Medication was injected into the left and right occipital nerve areas and suboccipital areas. Patient's condition is associated with inflammation of the greater occipital nerve and associated muscle groups. Injection was deemed medically necessary, reasonable and appropriate. Injection represents a separate and unique surgical service.  2. Trigeminal nerve blocks: Target areas in the temporal and supraorbital regions were identified via palpation and pain response.Medication was injected into the left and right trigeminal nerve areas. Patient's condition is associated with inflammation of the trigeminal nerves nerve and associated muscle  groups. Injection was deemed medically necessary, reasonable and appropriate. Injection represents a separate and unique surgical service.   The headache improved from 20/10 to 4/10. Patient tolerated the procedure well and no complications were noted.

## 2020-10-07 NOTE — Telephone Encounter (Signed)
Insurance will not cover Nexium either ,  Resubmitted prior authorization for pantoprazole again... 10/07/2020

## 2020-10-16 MED ORDER — PANTOPRAZOLE SODIUM 40 MG PO TBEC: 40.0000 mg | DELAYED_RELEASE_TABLET | Freq: Every day | ORAL | 3 refills | Status: DC

## 2020-10-16 NOTE — Telephone Encounter (Signed)
Prior authorization for pantoprazole approved from 09/22/2020 until 09/21/2021

## 2020-10-16 NOTE — Addendum Note (Signed)
Addended by: Marlowe Kays on: 10/16/2020 09:24 AM   Modules accepted: Orders

## 2020-11-04 ENCOUNTER — Encounter: Payer: Self-pay | Admitting: Neurology

## 2020-11-04 ENCOUNTER — Ambulatory Visit: Payer: BC Managed Care – PPO | Admitting: Neurology

## 2020-11-04 ENCOUNTER — Other Ambulatory Visit: Payer: Self-pay

## 2020-11-04 VITALS — BP 128/54 | HR 88 | Ht 66.0 in | Wt 118.0 lb

## 2020-11-04 DIAGNOSIS — N951 Menopausal and female climacteric states: Secondary | ICD-10-CM

## 2020-11-04 DIAGNOSIS — G43711 Chronic migraine without aura, intractable, with status migrainosus: Secondary | ICD-10-CM | POA: Diagnosis not present

## 2020-11-04 DIAGNOSIS — N952 Postmenopausal atrophic vaginitis: Secondary | ICD-10-CM | POA: Diagnosis not present

## 2020-11-04 MED ORDER — NURTEC 75 MG PO TBDP: 75.0000 mg | ORAL_TABLET | ORAL | 11 refills | Status: DC

## 2020-11-04 MED ORDER — VIVELLE-DOT 0.075 MG/24HR TD PTTW: 1.0000 | MEDICATED_PATCH | TRANSDERMAL | 12 refills | Status: DC

## 2020-11-04 NOTE — Progress Notes (Addendum)
[facility]    Provider:  Dr Lucia Gaskins Requesting Provider: Rodrigo Ran, MD Primary Care Provider:  Rodrigo Ran, MD  CC:  migraines  11/05/2020: Patient with episodic migraines, doing well on Lamictal over the last 6 months with 4-7 migraine daya a month.  Patient was doing excellent on Lamictal and using Nurtec as needed however recently she has had 2 severe headaches 1 of which we saw her in the office for nerve blocks.  This appears to coincide with her estradiol patch being changed from brand to generic.  We will try and fill out a PA for brand and in the meantime she can wear a higher dose of patch.  We did discuss increased risk of stroke in women with migraine with aura and the risks of increased stroke with estradiol patch.  Medications tried: Tylenol, Excedrin, atenolol, baclofen, Botox x3, Ubrelvy, trokendi, tramadol, nurtec, oxycodone, ondansetron, reglan, medrol dosepak, lamictal, Emgality, Aimovig, hydrocodone, cannot tolerate triptans, Ajovy, effexor, cymbalta, pristiq,celexa, Lamictal, botox,baclofen,atenolol, the dot changed to generic, nortriptyline, amitriptyline, topiramate, Zofran, Trokendi, axert, cafergot, DHE-45, imitrex tablet/injection, maxalt, midrin. She tells me she does not tolerate triptans well.     Addendum 08/27/2018: Patient continues to suffer with uncharacteristic severe headaches and migraines which have been ongoing for months.  Despite adequate treatment, nerve blocks, increasing atenolol, migraine infusions, CGRP she continues to worsen she is having chronic daily headaches now, her vision is worsening becoming more blurry with diplopia, the headaches are becoming progressively more positional and exertional and occipital.  Patient needs an MRI of the brain with and without contrast as soon as possible.  Interval history 07/24/2018: Patient is being seen today in the office due to intractable severe headache and status migrainosus.  This  is a very nice 65 year old patient with years of migraines (see below for history) and she is failed a tremendous amount of medications most first and second line migraine preventatives as well as many acute management medications such as triptans (for a full list see below).  She appears very uncomfortable today, she says her migraine is 10 out of 10.  Today we discussed multiple options, first was ways to break her migraine.  We also discussed acute management of her migraines in the future, and preventative medication.  Botox is the most appropriate way to proceed.  She has failed to CGRP medication and is currently not on them.  For more details on her migraines see history of present illness below.  Virtual Visit via Video Note 07/20/2018  I connected with@ on 11/13/20 at  4:00 PM EDT by a video enabled telemedicine application and verified that I am speaking with the correct person using two identifiers. Patient is at her work office and physician is in the office as well.    I discussed the limitations of evaluation and management by telemedicine and the availability of in person appointments. The patient expressed understanding and agreed to proceed.  Anson Fret, MD  HPI:  DAJSHA MASSARO is a 65 y.o. female here as requested by Rodrigo Ran, MD for migraines. Patient has had migraines for years and has tried many medications. Currently, she has had a 9-week migraine that waxes and wanes but is daily. She has missed work seeing patients 3 times She has been in status migrainosus in the past but  this is the first time she has had a migraine this long and intractable. The last time she had imaging was in 2018 and normal. The migraine  quality can be different, she does not have auras and she has tried all the triptans. Migraines are unilateral through the eye, pulsating and pounding, photo/phonophobia, movement makes it worse. Light is a trigger. Other times they can be all over the head. +nausea  often, when she does get nausea she is incapacitated 3x in the last 9 weeks. Migraines are severe or moderately severe.  Atenolol helps.  She has 18 headache days a month on average and 10 migraine days a month for longer than one year. Over the last 9 weeks daily migraines. She tried aimovig for 3 months last fall. She tried Emgality 2 doses and then the headache started for 9 weeks. She is not on CGRP at this time. She tried baclofen and compazine but could not tolerate it. Compazine made her stomach feel worse. Toradol makes her sick. She is not tolerant of any of the triptans. No medication overuse. Migraines can last a minimum of 24 hours.  No other focal neurologic deficits, associated symptoms, inciting events or modifiable factors.   Meds tried (this is not a complete list) : Zofran, tizanidine, atenolol, celexa, topiramate, propranolol, lopressor, phenergan, flexeril, lamictal, cymbalta, effexor, prozac, viibryd  Reviewed notes, labs and imaging from outside physicians, which showed:  She also takes Zofran  for nausea Tizanidine  for aches and pains and is helpful Atenolol  Celexa  QD  Current and past medications ANALGESICS:demerol,tylenol,ultram,vicodin ANTI-MIGRAINE: axert, cafergot, DHE-45, imitrex tablet/injection, maxalt, midrin. She tells me she does not tolerate triptans well.  HEART/BP: lopressor, atenolol (very helpful) DECONGESTANT/ANTIHISTAMINE: allegra, clarinex, zyrtec ANTI-NAUSEANT: phenergan, vistaril, zofran NSAIDS: ibuprofen, naproxen, mobic, toradol MUSCLE RELAXANTS: amrix, flexeril, tizanidine ANTI-CONVULSANTS: topamax (expressive aphasia), Klonopin, Lamictal STEROIDS: medrol SLEEPING PILLS/TRANQUILIZERS:ambien, melatonin, xanax  ANTI-DEPRESSANTS: celexa, cymbalta, effexor (increased anxiety) pristiq, prozac, viibryd HERBAL: magnesium citrate (GI upset) FIBROMYALGIA:  HORMONAL: OTHER:  PROCEDURES FOR HEADACHES:   Review of Systems: Patient  complains of symptoms per HPI as well as the following symptoms: headaches . Pertinent negatives and positives per HPI. All others negative    Social History   Socioeconomic History   Marital status: Married    Spouse name: Not on file   Number of children: 2   Years of education: Not on file   Highest education level: Master's degree (e.g., MA, MS, MEng, MEd, MSW, MBA)  Occupational History   Occupation: NP  Tobacco Use   Smoking status: Never   Smokeless tobacco: Never  Vaping Use   Vaping Use: Never used  Substance and Sexual Activity   Alcohol use: Never   Drug use: Never   Sexual activity: Not on file  Other Topics Concern   Not on file  Social History Narrative   Lives at home with spouse   Right handed   Caffeine: 2 cups daily    Social Determinants of Health   Financial Resource Strain: Not on file  Food Insecurity: Not on file  Transportation Needs: Not on file  Physical Activity: Not on file  Stress: Not on file  Social Connections: Not on file  Intimate Partner Violence: Not on file    Family History  Problem Relation Age of Onset   Lung cancer Father    Headache Father    Other Sister        ureter tumor, smoking related   Allergic Disorder Brother    Anxiety disorder Brother    Colon cancer Neg Hx    Colon polyps Neg Hx    Esophageal cancer Neg Hx  Rectal cancer Neg Hx     Past Medical History:  Diagnosis Date   Allergy    seasonal   Anxiety    Chronic headaches    Dyspareunia in female    GERD (gastroesophageal reflux disease)    prn   Greater trochanteric bursitis    bilateral   Hypersomnia    Migraines    Vitamin D deficiency    in the past per pt    Patient Active Problem List   Diagnosis Date Noted   GERD (gastroesophageal reflux disease) 09/22/2020   Chronic migraine without aura, with intractable migraine, so stated, with status migrainosus 07/24/2018    Past Surgical History:  Procedure Laterality Date    COLONOSCOPY     endoscopy     LAPAROSCOPIC TOTAL HYSTERECTOMY     UPPER GASTROINTESTINAL ENDOSCOPY     WISDOM TOOTH EXTRACTION      Current Outpatient Medications  Medication Sig Dispense Refill   ALPRAZolam (XANAX) 1 MG tablet Take 0.5 mg by mouth as needed for anxiety.     Armodafinil 250 MG tablet Take 125 mg by mouth 2 (two) times daily.     atenolol (TENORMIN) 50 MG tablet Take 1 tablet (50 mg total) by mouth daily. 90 tablet 4   citalopram (CELEXA) 40 MG tablet Take 1 tablet (40 mg total) by mouth daily. 90 tablet 6   dicyclomine (BENTYL) 10 MG capsule Take 1 capsule (10 mg total) by mouth 4 (four) times daily -  before meals and at bedtime. 90 capsule 0   esomeprazole (NEXIUM) 40 MG capsule 1 capsule daily in the AM 30 minutes before breakfast 90 capsule 1   estradiol (VIVELLE-DOT) 0.05 MG/24HR patch Place 1 patch onto the skin 2 (two) times a week.     HYDROcodone-acetaminophen (NORCO) 10-325 MG tablet Take 1 tablet by mouth every 4 (four) hours as needed for severe pain. 52 tablet 0   LAMICTAL 100 MG tablet Take 1 tablet (100 mg total) by mouth 2 (two) times daily. 180 tablet 3   ondansetron (ZOFRAN-ODT) 4 MG disintegrating tablet Take 1 tablet (4 mg total) by mouth every 8 (eight) hours as needed for nausea. 60 tablet 11   traMADol (ULTRAM) 50 MG tablet Take 50 mg by mouth as needed (pain).     Rimegepant Sulfate (NURTEC) 75 MG TBDP Take 75 mg by mouth every other day. 16 tablet 11   VIVELLE-DOT 0.075 MG/24HR Place 1 patch onto the skin 2 (two) times a week. 8 patch 12   No current facility-administered medications for this visit.    Allergies as of 11/04/2020   (No Known Allergies)    Vitals: BP (!) 128/54   Pulse 88   Ht 5\' 6"  (1.676 m)   Wt 118 lb (53.5 kg)   BMI 19.05 kg/m  Last Weight:  Wt Readings from Last 1 Encounters:  11/04/20 118 lb (53.5 kg)   Last Height:   Ht Readings from Last 1 Encounters:  11/04/20 5\' 6"  (1.676 m)   Exam: NAD, pleasant                   Speech:    Speech is normal; fluent and spontaneous with normal comprehension.  Cognition:    The patient is oriented to person, place, and time;     recent and remote memory intact;     language fluent;    Cranial Nerves:    The pupils are equal, round, and reactive to light.Trigeminal sensation  is intact and the muscles of mastication are normal. The face is symmetric. The palate elevates in the midline. Hearing intact. Voice is normal. Shoulder shrug is normal. The tongue has normal motion without fasciculations.   Coordination:  No dysmetria  Motor Observation:    No asymmetry, no atrophy, and no involuntary movements noted. Tone:    Normal muscle tone.     Strength:    Strength is V/V in the upper and lower limbs.      Sensation: intact to LT    Assessment/Plan:  65 year old patient with intractable chronic migraines without aura. FAILED CGRP(Aimovig, Emgality) and is no long on these meds.  Patient with episodic migraines, doing well on Lamictal over the last 6 months with 4-7 migraine daya a month.  Continue on current regimen of atenolol, Celexa, Lamictal, Zofran, Nurtec, for severe migraines I do give her hydrocodone that she uses rarely, she is on an estradiol patch and has had several severe migraines since switching from brand to generic, we will try to fill out a PA for her in the meantime increase patch dosage, we did discuss increased risk of stroke in women with migraine with aura.  Estrogen-associated migraines and atrophic vagina in menopause: the vivelle dot works great if brand. Generic failed(tried increasing generic dose as well) and does not tolerate generic with side effects, needs BRAND only   Naomie Dean, MD  [facility] [address] [place], [place] [address]  Phone [phone] Fax [phone]  I spent 30 minutes of face-to-face and non-face-to-face time with patient on the  1. Chronic migraine without  aura, with intractable migraine, so stated, with status migrainosus   2. Post-menopause atrophic vaginitis   3. Symptoms, such as flushing, sleeplessness, headache, lack of concentration, associated with the menopause    diagnosis.  This included previsit chart review, lab review, study review, order entry, electronic health record documentation, patient education on the different diagnostic and therapeutic options, counseling and coordination of care, risks and benefits of management, compliance, or risk factor reduction

## 2020-11-12 ENCOUNTER — Telehealth: Payer: Self-pay | Admitting: *Deleted

## 2020-11-12 NOTE — Telephone Encounter (Signed)
Completed Nurtec PA on Cover My Meds. Key: [id number]. Awaiting determination from [employer].

## 2020-11-13 ENCOUNTER — Telehealth: Payer: Self-pay | Admitting: *Deleted

## 2020-11-13 NOTE — Telephone Encounter (Signed)
Completed vivelle dot patch PA per DR Lucia Gaskins. Pt cannot tolerate generic patch. She had severe side effects with the generic.  Key: [id number] - Rx #: [id number].

## 2020-11-17 NOTE — Telephone Encounter (Signed)
Effective from 11/12/2020 through 11/11/2021. Approved for 15 tablets per 30 days cont'n episodic.

## 2020-11-24 ENCOUNTER — Telehealth: Payer: Self-pay | Admitting: Neurology

## 2020-11-24 NOTE — Telephone Encounter (Signed)
We received another PA for the Vivelle-Dot. I have completed this. Awaiting determination from [employer]. KeyGertie Fey - Rx #: [id number]

## 2020-11-24 NOTE — Telephone Encounter (Signed)
Tuesday @ [facility] for [facility] is asking for a call from RN for clinical information for a medication for pt, please call.

## 2020-11-25 NOTE — Telephone Encounter (Signed)
LMVM for [facility] to return call about Vivelle dot patch.

## 2020-11-25 NOTE — Telephone Encounter (Signed)
Denied 11-15-20 on CMM Brand and Generic not covered.

## 2020-11-25 NOTE — Telephone Encounter (Signed)
Received call back from [facility] wanting to clarify if patient is on both Ajovy and Nurtec or if it is replacing. I advised pt's active med list shows patient is only on Nurtec and not Ajovy. She verbalized understanding and appreciation.

## 2020-12-09 ENCOUNTER — Encounter: Payer: BC Managed Care – PPO | Admitting: Gastroenterology

## 2020-12-23 ENCOUNTER — Telehealth: Payer: Self-pay

## 2020-12-23 NOTE — Telephone Encounter (Signed)
Pt did not show up for pre-visit.  I called pt and she said she had her friend call ans cancel her previsit for today and also her colonoscopy for 12/29/20 with Dr. Lavon Paganini.  Pt said she did not want to do another prep.  She asked if she could have a colonic for her cleansing.  I told her no, that she would have to take a prep.  Pt was nice but said she could not do another prep.  Reported the pre made her sick.  I asked to to call us back if she changes her mind.  maw

## 2020-12-29 ENCOUNTER — Encounter: Payer: BC Managed Care – PPO | Admitting: Gastroenterology

## 2021-01-05 ENCOUNTER — Ambulatory Visit: Payer: BC Managed Care – PPO | Admitting: Neurology

## 2021-01-05 ENCOUNTER — Encounter: Payer: Self-pay | Admitting: Neurology

## 2021-01-05 DIAGNOSIS — G43001 Migraine without aura, not intractable, with status migrainosus: Secondary | ICD-10-CM | POA: Diagnosis not present

## 2021-01-05 DIAGNOSIS — G43101 Migraine with aura, not intractable, with status migrainosus: Secondary | ICD-10-CM | POA: Insufficient documentation

## 2021-01-05 MED ORDER — ELYXYB 120 MG/4.8ML PO SOLN: 120.0000 mg | Freq: Once | ORAL | Status: AC

## 2021-01-05 MED ORDER — QULIPTA 60 MG PO TABS: 60.0000 mg | ORAL_TABLET | Freq: Every day | ORAL | 11 refills | Status: DC

## 2021-01-05 MED ORDER — VIVELLE-DOT 0.075 MG/24HR TD PTTW: 1.0000 | MEDICATED_PATCH | TRANSDERMAL | 12 refills | Status: AC

## 2021-01-05 MED ORDER — REYVOW 100 MG PO TABS: 100.0000 mg | ORAL_TABLET | Freq: Once | ORAL | 0 refills | Status: DC | PRN

## 2021-01-05 MED ORDER — PROMETHAZINE HCL 50 MG PO TABS: 50.0000 mg | ORAL_TABLET | Freq: Four times a day (QID) | ORAL | 6 refills | Status: DC | PRN

## 2021-01-05 MED ORDER — LAMOTRIGINE 100 MG PO TABS: ORAL_TABLET | ORAL | 3 refills | Status: DC

## 2021-01-05 NOTE — Patient Instructions (Signed)
Atogepant tablets What is this medication? ATOGEPANT (a TOE je pant) is used to prevent migraine headaches. This medicine may be used for other purposes; ask your health care provider or pharmacist if you have questions. COMMON BRAND NAME(S): QULIPTA What should I tell my care team before I take this medication? They need to know if you have any of these conditions: kidney disease liver disease an unusual or allergic reaction to atogepant, other medicines, foods, dyes, or preservatives pregnant or trying to get pregnant breast-feeding How should I use this medication? Take this medicine by mouth with water. Take it as directed on the prescription label at the same time every day. You can take it with or without food. If it upsets your stomach, take it with food. Keep taking it unless your health care provider tells you to stop. Talk to your health care provider about the use of this medicine in children. Special care may be needed. Overdosage: If you think you have taken too much of this medicine contact a poison control center or emergency room at once. NOTE: This medicine is only for you. Do not share this medicine with others. What if I miss a dose? If you miss a dose, take it as soon as you can. If it is almost time for your next dose, take only that dose. Do not take double or extra doses. What may interact with this medication? carbamazepine certain medicines for fungal infections like itraconazole, ketoconazole clarithromycin cyclosporine efavirenz etravirine phenytoin rifampin St. John's Wort This list may not describe all possible interactions. Give your health care provider a list of all the medicines, herbs, non-prescription drugs, or dietary supplements you use. Also tell them if you smoke, drink alcohol, or use illegal drugs. Some items may interact with your medicine. What should I watch for while using this medication? Visit your health care provider for regular checks  on your progress. Tell your health care provider if your symptoms do not start to get better or if they get worse. What side effects may I notice from receiving this medication? Side effects that you should report to your doctor or health care provider as soon as possible: allergic reactions (skin rash, itching or hives; swelling of the face, lips, tongue) light-colored stool liver injury (dark yellow or brown urine; general ill feeling or flu-like symptoms; loss of appetite, right upper belly pain; unusually weak or tired, yellowing of the eyes or skin) Side effects that usually do not require medical attention (report these to your doctor or health care provider if they continue or are bothersome): constipation lack or loss of appetite nausea unusually weak or tired weight loss This list may not describe all possible side effects. Call your doctor for medical advice about side effects. You may report side effects to FDA at 1-800-FDA-1088. Where should I keep my medication? Keep out of the reach of children and pets. Store at room temperature between 20 and 25 degrees C (68 and 77 degrees F). Get rid of any unused medicine after the expiration date. To get rid of medicines that are no longer needed or have expired: Take the medicine to a medicine take-back program. Check with your pharmacy or law enforcement to find a location. If you cannot return the medicine, check the label or package insert to see if the medicine should be thrown out in the garbage or flushed down the toilet. If you are not sure, ask your health care provider. If it is safe to put it  in the trash, take the medicine out of the container. Mix the medicine with cat litter, dirt, coffee grounds, or other unwanted substance. Seal the mixture in a bag or container. Put it in the trash. NOTE: This sheet is a summary. It may not cover all possible information. If you have questions about this medicine, talk to your doctor,  pharmacist, or health care provider.  2022 Elsevier/Gold Standard (2020-01-17 11:20:03) Celecoxib Solution What is this medication? CELECOXIB (sell a KOX ib) treats mild to moderate pain, inflammation, or arthritis. It belongs to a group of medications called NSAIDs. This medicine may be used for other purposes; ask your health care provider or pharmacist if you have questions. COMMON BRAND NAME(S): ELYXYB What should I tell my care team before I take this medication? They need to know if you have any of these conditions: Cigarette smoker Coronary artery bypass graft (CABG) surgery within the past 2 weeks Drink more than 3 alcohol-containing drinks a day Heart disease High blood pressure History of stomach bleeding Kidney disease Liver disease Lung or breathing disease, like asthma An unusual or allergic reaction to celecoxib, sulfa medications, aspirin, other NSAIDs, other medications, foods, dyes, or preservatives Pregnant or trying to get pregnant Breast-feeding How should I use this medication? Take this medication by mouth. Follow the directions on the prescription label. Use a specially marked oral syringe, spoon, or dropper to measure each dose. Ask your pharmacist if you do not have one. Household spoons are not accurate. A special MedGuide will be given to you by the pharmacist with each prescription and refill. Be sure to read this information carefully each time. Talk to your care team regarding the use of this medication in children. Special care may be needed. Overdosage: If you think you have taken too much of this medicine contact a poison control center or emergency room at once. NOTE: This medicine is only for you. Do not share this medicine with others. What if I miss a dose? This does not apply. This medication is not for regular use. What may interact with this medication? Do not take this medication with any of the  following: Cidofovir Ketorolac Thioridazine This medication may also interact with the following: Alcohol Aspirin and aspirin-like medications Atomoxetine Certain medications for blood pressure, heart disease, irregular heart beat Certain medications for depression, anxiety, or psychotic disorders Certain medications that treat or prevent blood clots like warfarin, enoxaparin, dalteparin, apixaban, dabigatran, and rivaroxaban Cyclosporine Digoxin Diuretics Fluconazole Lithium Methotrexate Other NSAIDs, medications for pain and inflammation, like ibuprofen or naproxen Pemetrexed Rifampin Steroid medications like prednisone or cortisone This list may not describe all possible interactions. Give your health care provider a list of all the medicines, herbs, non-prescription drugs, or dietary supplements you use. Also tell them if you smoke, drink alcohol, or use illegal drugs. Some items may interact with your medicine. What should I watch for while using this medication? Visit your care team for regular checks on your progress. Tell your care team if your symptoms do not start to get better or if they get worse. Do not take other medications that contain aspirin, ibuprofen, or naproxen with this medication. Side effects such as stomach upset, nausea, or ulcers may be more likely to occur. Many non-prescription medications contain aspirin, ibuprofen, or naproxen. Always read labels carefully. This medication can cause serious ulcers and bleeding in the stomach. It can happen with no warning. Smoking, drinking alcohol, older age, and poor health can also increase risks. Call your  care team right away if you have stomach pain or blood in your vomit or stool. This medication does not prevent a heart attack or stroke. This medication may increase the chance of a heart attack or stroke. The chance may increase the longer you use this medication or if you have heart disease. If you take aspirin to  prevent a heart attack or stroke, talk to your care team about using this medication. Alcohol may interfere with the effect of this medication. Avoid alcoholic drinks. This medication may cause serious skin reactions. They can happen weeks to months after starting the medication. Contact your care team right away if you notice fevers or flu-like symptoms with a rash. The rash may be red or purple and then turn into blisters or peeling of the skin. Or, you might notice a red rash with swelling of the face, lips or lymph nodes in your neck or under your arms. Talk to your care team if you are pregnant before taking this medication. Taking this medication between weeks 20 and 30 of pregnancy may harm your unborn baby. Your care team will monitor you closely if you need to take it. After 30 weeks of pregnancy, do not take this medication. You may get drowsy or dizzy. Do not drive, use machinery, or do anything that needs mental alertness until you know how this medication affects you. Do not stand up or sit up quickly, especially if you are an older patient. This reduces the risk of dizzy or fainting spells. Be careful brushing or flossing your teeth or using a toothpick because you may get an infection or bleed more easily. If you have any dental work done, tell your dentist you are receiving this medication. This medication may make it more difficult to get pregnant. Talk to your care team if you are concerned about your fertility. What side effects may I notice from receiving this medication? Side effects that you should report to your care team as soon as possible: Allergic reactions-skin rash, itching, hives, swelling of the face, lips, tongue, or throat Bleeding-bloody or black, tar-like stools, vomiting blood or brown material that looks like coffee grounds, red or dark brown urine, small red or purple spots on skin, unusual bruising or bleeding Heart attack-pain or tightness in the chest, shoulders,  arms, or jaw, nausea, shortness of breath, cold or clammy skin, feeling faint or lightheaded Heart failure-shortness of breath, swelling of ankles, feet, or hands, sudden weight gain, unusual weakness or fatigue Increase in blood pressure Kidney injury-decrease in the amount of urine, swelling of the ankles, hands, or feet Liver injury-right upper belly pain, loss of appetite, nausea, light-colored stool, dark yellow or brown urine, yellowing skin or eyes, unusual weakness or fatigue Rash, fever, and swollen lymph nodes Redness, blistering, peeling, or loosening of the skin, including inside the mouth Stroke-sudden numbness or weakness of the face, arm, or leg, trouble speaking, confusion, trouble walking, loss of balance or coordination, dizziness, severe headache, change in vision Side effects that usually do not require medical attention (report to your care team if they continue or are bothersome): Change in taste Headache Loss of appetite Nausea Upset stomach This list may not describe all possible side effects. Call your doctor for medical advice about side effects. You may report side effects to FDA at 1-800-FDA-1088. Where should I keep my medication? Keep out of the reach of children and pets. Store at room temperature between 15 and 30 degrees C (59 and 86 degrees  F). Get rid of any unused medication after the expiration date. To get rid of medications that are no longer needed or have expired: Take the medication to a medication take-back program. Check with your pharmacy or law enforcement to find a location. If you cannot return the medication, check the label or package insert to see if the medication should be thrown out in the garbage or flushed down the toilet. If you are not sure, ask your care team. If it is safe to put it in the trash, empty the medication out of the container. Mix the medication with cat litter, dirt, coffee grounds, or other unwanted substance. Seal the  mixture in a bag or container. Put it in the trash. NOTE: This sheet is a summary. It may not cover all possible information. If you have questions about this medicine, talk to your doctor, pharmacist, or health care provider.  2022 Elsevier/Gold Standard (2020-05-02 12:35:37) Lasmiditan tablets What is this medication? LASMIDITAN (las MID i tan) is used to treat migraines with or without aura. An aura is a strange feeling or visual disturbance that warns you of an attack. It is not used to prevent migraines. This medicine may be used for other purposes; ask your health care provider or pharmacist if you have questions. COMMON BRAND NAME(S): REYVOW What should I tell my care team before I take this medication? They need to know if you have any of these conditions: heart disease liver disease an unusual or allergic reaction to lasmiditan, other medicines, foods, dyes, or preservatives pregnant or trying to get pregnant breast-feeding How should I use this medication? Take this medicine by mouth with water. Take it as directed on the label. Do not cut, crush or chew this medicine. Swallow the tablets whole. You can take it with or without food. If it upsets your stomach, take it with food. Do not use it more often than directed. A special MedGuide will be given to you by the pharmacist with each prescription and refill. Be sure to read this information carefully each time. Talk to your health care provider about the use of this medicine in children. Special care may be needed. Overdosage: If you think you have taken too much of this medicine contact a poison control center or emergency room at once. NOTE: This medicine is only for you. Do not share this medicine with others. What if I miss a dose? This does not apply. This medicine is not for regular use. It should only be used as needed. What may interact with this medication? This medicine may interact with the following  medications: alcohol antihistamines for allergy, cough, and cold certain medicines for anxiety or sleep certain medicines for blood pressure, heart disease, irregular heart beat certain medicines for depression, anxiety, or psychotic disorders certain medicines for seizures like phenobarbital, primidone dextromethorphan general anesthetics like halothane, isoflurane, methoxyflurane, propofol local anesthetics like lidocaine, pramoxine, tetracaine medicines that relax muscles for surgery narcotic medicines for pain phenothiazines like chlorpromazine, mesoridazine, prochlorperazine, thioridazine St. John's wort This list may not describe all possible interactions. Give your health care provider a list of all the medicines, herbs, non-prescription drugs, or dietary supplements you use. Also tell them if you smoke, drink alcohol, or use illegal drugs. Some items may interact with your medicine. What should I watch for while using this medication? Visit your health care provider for regular checks on your progress. Tell your health care provider if your symptoms do not start to get better or  if they get worse. You may get drowsy or dizzy. Do not drive, use machinery, or do anything that needs mental alertness until you know how this medicine affects you. Do not stand up or sit up quickly, especially if you are an older patient. This reduces the risk of dizzy or fainting spells. Alcohol may interfere with the effect of this medicine. Avoid alcoholic drinks. Tell your health care provider right away if you have any change in your eyesight. What side effects may I notice from receiving this medication? Side effects that you should report to your doctor or health care professional as soon as possible: allergic reactions (skin rash, itching or hives; swelling of the face, lips, or tongue) changes in vision heartbeat rhythm changes (trouble breathing; chest pain; dizziness; fast, irregular heartbeat;  feeling faint or lightheaded, falls) Side effects that usually do not require medical attention (report these to your doctor or health care professional if they continue or are bothersome): dizziness drowsiness headache nausea, vomiting palpitations tiredness This list may not describe all possible side effects. Call your doctor for medical advice about side effects. You may report side effects to FDA at 1-800-FDA-1088. Where should I keep my medication? Keep out of the reach of children and pets. This medicine can be abused. Keep it in a safe place to protect it from theft. Do not share it with anyone. It is only for you. Selling or giving away this medicine is dangerous and against the law. Store at room temperature between 20 and 25 degrees C (68 and 77 degrees F). Get rid of any unused medicine after the expiration date. This medicine may cause harm and death if it is taken by other adults, children, or pets. It is important to get rid of the medicine as soon as you no longer need it or it is expired. You can do this in two ways: Take the medicine to a medicine take-back program. Check with your pharmacy or law enforcement to find a location. If you cannot return the medicine, check the label or package insert to see if the medicine should be thrown out in the garbage or flushed down the toilet. If you are not sure, ask your health care provider. If it is safe to put it in the trash, take the medicine out of the container. Mix the medicine with cat litter, dirt, coffee grounds, or other unwanted substance. Seal the mixture in a bag or container. Put it in the trash. NOTE: This sheet is a summary. It may not cover all possible information. If you have questions about this medicine, talk to your doctor, pharmacist, or health care provider.  2022 Elsevier/Gold Standard (2019-10-03 12:03:02)

## 2021-01-05 NOTE — Addendum Note (Signed)
Addended by: Guy Begin on: 01/05/2021 10:26 AM   Modules accepted: Orders

## 2021-01-05 NOTE — Progress Notes (Addendum)
.  Nerve block BUPIVACAINE 0.5% 43ml total LOT [id number] exp 08-17-21 NDC 0175-1025-85

## 2021-01-05 NOTE — Addendum Note (Signed)
Addended by: Naomie Dean B on: 01/05/2021 11:18 AM   Modules accepted: Orders

## 2021-01-05 NOTE — Addendum Note (Signed)
Addended by: Naomie Dean B on: 01/05/2021 10:44 AM   Modules accepted: Orders

## 2021-01-05 NOTE — Progress Notes (Addendum)
Patient with 4-7 migraine days a month > 6 months. But here for a particularlry severe episode. Nerve blocks today.EPISODIC MIGRAINE.  Increase lamictal Try Qulipta for episodic migraine prevention Vivelle dot, try again to get brain, f/u with obgyn Try elyxyb and reyvow acutely   Meds ordered this encounter  Medications   Atogepant (QULIPTA) 60 MG TABS    Sig: Take 60 mg by mouth daily.    Dispense:  30 tablet    Refill:  11    EPISODIC MIGRAINES. Tried atenolol, propranolol, topiramate,venlafaxine, gabapentin,lamictal,amitriptyline, cymbalta, sumatriptan, rizatriptan, zolmitriptan, eletriptan (cannot tolerate triptans) and many more.   lamoTRIgine (LAMICTAL) 100 MG tablet    Sig: Take 100mg (1 tab) in the morning and 200mg (2 tab) at bedtime    Dispense:  270 tablet    Refill:  3   VIVELLE-DOT 0.075 MG/24HR    Sig: Place 1 patch onto the skin 2 (two) times a week.    Dispense:  8 patch    Refill:  12    Brand only   promethazine (PHENERGAN) 50 MG tablet    Sig: Take 1 tablet (50 mg total) by mouth every 6 (six) hours as needed for nausea or vomiting.    Dispense:  30 tablet    Refill:  6    Dispense 30 or max allowed by insurance.   Celecoxib (ELYXYB) 120 MG/4.8ML SOLN    Sig: Take 120 mg by mouth once for 1 dose.    Dispense:  4.8 mL    Refill:  ML   Lasmiditan Succinate (REYVOW) 100 MG TABS    Sig: Take 100 mg by mouth once as needed for up to 1 dose.    Dispense:  1 tablet    Refill:  0     1/2in 30-gauge needle was used. All procedures a documented below  were medically necessary, reasonable and appropriate based on the patient's history, medical diagnosis and physician opinion. Verbal informed consent was obtained from the patient, patient was informed of potential risk of procedure, including bruising, bleeding, hematoma formation, infection, muscle weakness, muscle pain, numbness, transient hypertension, transient hyperglycemia and transient insomnia among others. All  areas injected were topically clean with isopropyl rubbing alcohol. Nonsterile nonlatex gloves were worn during the procedure.  1. Greater occipital nerve block [phone]). The greater occipital nerve site was identified at the nuchal line medial to the occipital artery. Medication was injected into the left and right occipital nerve areas and suboccipital areas. Patient's condition is associated with inflammation of the greater occipital nerve and associated multiple groups. Injection was deemed medically necessary, reasonable and appropriate. Injection represents a separate and unique surgical service.  2.Supraorbital nerve block (64400): Supraorbital nerve site was identified along the incision of the frontal bone on the orbital/supraorbital ridge. Medication was injected into the left and right supraorbital nerve areas. Patient's condition is associated with inflammation of the supraorbital and associated muscle groups. Injection was deemed medically necessary, reasonable and appropriate. Injection represents a separate and unique surgical service.

## 2021-01-07 ENCOUNTER — Other Ambulatory Visit: Payer: Self-pay | Admitting: Neurology

## 2021-01-07 MED ORDER — METHYLPREDNISOLONE 4 MG PO TBPK: ORAL_TABLET | ORAL | 1 refills | Status: DC

## 2021-03-07 ENCOUNTER — Telehealth: Payer: Self-pay | Admitting: Neurology

## 2021-03-07 ENCOUNTER — Other Ambulatory Visit: Payer: Self-pay | Admitting: Neurology

## 2021-03-07 MED ORDER — HYDROCODONE-ACETAMINOPHEN 10-325 MG PO TABS: 1.0000 | ORAL_TABLET | ORAL | 0 refills | Status: DC | PRN

## 2021-03-07 NOTE — Telephone Encounter (Signed)
I need to add Eudelia Hiltunen on at the end of a day 4pm. Monday, Tuesday or Wednesday. Can we get her in, in the next 3-4 weeks?

## 2021-03-10 DIAGNOSIS — Z6821 Body mass index (BMI) 21.0-21.9, adult: Secondary | ICD-10-CM | POA: Diagnosis not present

## 2021-03-10 DIAGNOSIS — Z01419 Encounter for gynecological examination (general) (routine) without abnormal findings: Secondary | ICD-10-CM | POA: Diagnosis not present

## 2021-03-11 NOTE — Telephone Encounter (Signed)
I called Michele [name] and relayed trying to her her in 3-4 wks,  [name] and tried to reach on mychart. Noted that later email stating that 03-16-21 is not available. Pt stated her VM is not full. I relayed will try to contact again relating to appt.

## 2021-03-11 NOTE — Telephone Encounter (Signed)
Tried to call pt as well but her VM was full.

## 2021-03-16 ENCOUNTER — Telehealth: Payer: Self-pay | Admitting: Neurology

## 2021-03-16 NOTE — Telephone Encounter (Signed)
Spoke with pt and got her scheduled for 12/14 at 330 PM x 1 hr appt.

## 2021-03-16 NOTE — Telephone Encounter (Signed)
Per Dr Lucia Gaskins, we can schedule her on 12/14. She will reach out to pt.

## 2021-03-16 NOTE — Telephone Encounter (Signed)
Can we schedule Michele [name] for at least an hour on Dece,ber 14th? She will need a later appointment make it 3 or later and have her as my last patient so I have lots of time with her

## 2021-04-01 ENCOUNTER — Encounter: Payer: Self-pay | Admitting: Neurology

## 2021-04-01 ENCOUNTER — Ambulatory Visit (INDEPENDENT_AMBULATORY_CARE_PROVIDER_SITE_OTHER): Payer: Medicare Other | Admitting: Neurology

## 2021-04-01 VITALS — BP 112/74 | HR 61 | Ht 66.0 in | Wt 125.0 lb

## 2021-04-01 DIAGNOSIS — G43101 Migraine with aura, not intractable, with status migrainosus: Secondary | ICD-10-CM | POA: Diagnosis not present

## 2021-04-01 DIAGNOSIS — K146 Glossodynia: Secondary | ICD-10-CM | POA: Diagnosis not present

## 2021-04-01 DIAGNOSIS — G43711 Chronic migraine without aura, intractable, with status migrainosus: Secondary | ICD-10-CM

## 2021-04-01 NOTE — Patient Instructions (Signed)
Lidocaine nasal spray Sphenopalatine ganglion nerve block

## 2021-04-01 NOTE — Progress Notes (Signed)
[facility]    Provider:  Dr Lucia Gaskins Requesting Provider: Rodrigo Ran, MD Primary Care Provider:  Rodrigo Ran, MD  CC:  migraines  04/01/2021:  sam at [facility]. The last 2 months, on vivelle brand, she has been doing better. She has had migraines severe for years, very hormonal, she has had significant auras, migraine with and without aura, sometimes will get migraine aura without headache. She goes most of th emonth without a headache. Michele [name] gave her a hedache every morning. When she gets a migraine she doesn't wake up with it but it may develop very early. Nurtec may work if she catches it very early.  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  At this time the next thing I would try is memantine or Vyepti.Burning mouth - sphenopalatine ganglion block, compound lidocaine nasal spray  Patient complains of symptoms per HPI as well as the following symptoms: stress . Pertinent negatives and positives per HPI. All others negative   11/05/2020: Patient with episodic migraines, doing well on Lamictal over the last 6 months with 4-7 migraine daya a month.  Patient was doing excellent on Lamictal and using Nurtec as needed however recently she has had 2 severe headaches 1 of which we saw her in the office for nerve blocks.  This appears to coincide with her estradiol patch being changed from brand to generic.  We will try and fill out a PA for brand and in the meantime she can wear a higher dose of patch.  We did discuss increased risk of stroke in women with migraine with aura and the risks of increased stroke with estradiol patch.  Medications tried: Tylenol, Excedrin, atenolol, baclofen,  Botox x3, Ubrelvy, trokendi, tramadol, nurtec, oxycodone, ondansetron, reglan, medrol dosepak, lamictal, Emgality, Aimovig, hydrocodone, cannot tolerate triptans, reglan, phenergan, nurtec, ubrelvy, trokendi, effexor Ajovy, effexor, cymbalta, pristiq,celexa, Lamictal, botox,baclofen,atenolol, the dot changed to generic, nortriptyline, amitriptyline, topiramate, Zofran, Trokendi, axert, cafergot, DHE-45, imitrex tablet/injection, maxalt, midrin. She tells me she does not tolerate triptans well. Lamictal, Qulipta, Vivelle dot, Try elyxyb and reyvow acutely  Try: Trudhesa, Memantine, candesartan, vyepti, cymbalta, depakote   Addendum 08/27/2018: Patient continues to suffer with uncharacteristic severe headaches and migraines which have been ongoing for months.  Despite adequate treatment, nerve blocks, increasing atenolol, migraine infusions, CGRP she continues to worsen she is having chronic daily headaches now, her vision is worsening becoming more blurry with diplopia, the headaches are becoming progressively more positional and exertional and occipital.  Patient needs an MRI of the brain with and without contrast as soon as possible.  Interval history 07/24/2018: Patient is being seen today in the office due to intractable severe headache and status migrainosus.  This is a very nice 65 year old patient with years of migraines (see below for history) and she is failed a tremendous amount of medications most first and second line migraine preventatives as well as many acute management medications such as triptans (for a full list see below).  She appears very uncomfortable today, she says her migraine is 10 out of 10.  Today we discussed multiple options, first was ways to break her migraine.  We also discussed acute management of her migraines in the future, and preventative  medication.  Botox is the most appropriate way to proceed.  She has failed to CGRP medication and is currently not on them.  For more  details on her migraines see history of present illness below.  Virtual Visit via Video Note 07/20/2018  I connected with@ on 04/12/21 at  3:30 PM EST by a video enabled telemedicine application and verified that I am speaking with the correct person using two identifiers. Patient is at her work office and physician is in the office as well.    I discussed the limitations of evaluation and management by telemedicine and the availability of in person appointments. The patient expressed understanding and agreed to proceed.  Michele Fret, MD  HPI:  Michele [name] is a 65 y.o. female here as requested by Rodrigo Ran, MD for migraines. Patient has had migraines for years and has tried many medications. Currently, she has had a 9-week migraine that waxes and wanes but is daily. She has missed work seeing patients 3 times She has been in status migrainosus in the past but  this is the first time she has had a migraine this long and intractable. The last time she had imaging was in 2018 and normal. The migraine quality can be different, she does not have auras and she has tried all the triptans. Migraines are unilateral through the eye, pulsating and pounding, photo/phonophobia, movement makes it worse. Light is a trigger. Other times they can be all over the head. +nausea often, when she does get nausea she is incapacitated 3x in the last 9 weeks. Migraines are severe or moderately severe.  Atenolol helps.  She has 18 headache days a month on average and 10 migraine days a month for longer than one year. Over the last 9 weeks daily migraines. She tried aimovig for 3 months last fall. She tried Emgality 2 doses and then the headache started for 9 weeks. She is not on CGRP at this time. She tried baclofen and compazine but could not tolerate it. Compazine made her stomach feel worse. Toradol makes her sick. She is not tolerant of any of the triptans. No medication overuse. Migraines can last a minimum of 24  hours.  No other focal neurologic deficits, associated symptoms, inciting events or modifiable factors.   Meds tried (this is not a complete list) : Zofran, tizanidine, atenolol, celexa, topiramate, propranolol, lopressor, phenergan, flexeril, lamictal, cymbalta, effexor, prozac, viibryd  Reviewed notes, labs and imaging from outside physicians, which showed:  She also takes Zofran 4mg  for nausea Tizanidine 2mg  for aches and pains and is helpful Atenolol 50mg  Celexa 40mg  QD  Current and past medications ANALGESICS:demerol,tylenol,ultram,vicodin ANTI-MIGRAINE: axert, cafergot, DHE-45, imitrex tablet/injection, maxalt, midrin. She tells me she does not tolerate triptans well.  HEART/BP: lopressor, atenolol (very helpful) DECONGESTANT/ANTIHISTAMINE: allegra, clarinex, zyrtec ANTI-NAUSEANT: phenergan, vistaril, zofran NSAIDS: ibuprofen, naproxen, mobic, toradol MUSCLE RELAXANTS: amrix, flexeril, tizanidine ANTI-CONVULSANTS: topamax (expressive aphasia), Klonopin, Lamictal STEROIDS: medrol SLEEPING PILLS/TRANQUILIZERS:ambien, melatonin, xanax  ANTI-DEPRESSANTS: celexa, cymbalta, effexor (increased anxiety) pristiq, prozac, viibryd HERBAL: magnesium citrate (GI upset) FIBROMYALGIA:  HORMONAL: OTHER:  PROCEDURES FOR HEADACHES:   Review of Systems: Patient complains of symptoms per HPI as well as the following symptoms: headaches . Pertinent negatives and positives per HPI. All others negative    Social History   Socioeconomic History   Marital status: Married    Spouse name: Not on file   Number of children: 2   Years of education: Not on file   Highest education level:  Master's degree (e.g., MA, MS, MEng, MEd, MSW, MBA)  Occupational History   Occupation: NP  Tobacco Use   Smoking status: Never   Smokeless tobacco: Never  Vaping Use   Vaping Use: Never used  Substance and Sexual Activity   Alcohol use: Never   Drug use: Never   Sexual activity: Not on file  Other  Topics Concern   Not on file  Social History Narrative   Lives at home with spouse   Right handed   Caffeine: 2 cups daily    Social Determinants of Health   Financial Resource Strain: Not on file  Food Insecurity: Not on file  Transportation Needs: Not on file  Physical Activity: Not on file  Stress: Not on file  Social Connections: Not on file  Intimate Partner Violence: Not on file    Family History  Problem Relation Age of Onset   Lung cancer Father    Headache Father    Other Sister        ureter tumor, smoking related   Allergic Disorder Brother    Anxiety disorder Brother    Colon cancer Neg Hx    Colon polyps Neg Hx    Esophageal cancer Neg Hx    Rectal cancer Neg Hx     Past Medical History:  Diagnosis Date   Allergy    seasonal   Anxiety    Chronic headaches    Dyspareunia in female    GERD (gastroesophageal reflux disease)    prn   Greater trochanteric bursitis    bilateral   Hypersomnia    Migraines    Vitamin D deficiency    in the past per pt    Patient Active Problem List   Diagnosis Date Noted   Chronic migraine without aura, with intractable migraine, so stated, with status migrainosus 04/12/2021   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     atenolol (TENORMIN) 50 MG tablet Take 1 tablet (50 mg total) by mouth daily. 90 tablet 4   citalopram (CELEXA) 40 MG tablet Take 1  tablet (40 mg total) by mouth daily. 90 tablet 6   HYDROcodone-acetaminophen (NORCO) 10-325 MG tablet Take 1 tablet by mouth every 4 (four) hours as needed for severe pain. 42 tablet 0   lamoTRIgine (LAMICTAL) 100 MG tablet Take (1 tab) in the morning and (2 tab) at bedtime 270 tablet 3   ondansetron (ZOFRAN-ODT) 4 MG disintegrating tablet Take 1 tablet (4 mg total) by mouth every 8 (eight) hours as needed for nausea. 60 tablet 11   Rimegepant Sulfate (NURTEC) 75 MG TBDP Take 75 mg by mouth every other day. 16 tablet 11   VIVELLE-DOT 0.075 MG/24HR Place 1 patch onto the skin 2 (two) times a week. 8 patch 12   clonazePAM (KLONOPIN) 1 MG tablet Take 1 tablet ( ) three times a day as needed 90 tablet 1  No current facility-administered medications for this visit.    Allergies as of 04/01/2021   (No Known Allergies)    Vitals: BP 112/74 (BP Location: Right Arm, Patient Position: Sitting)    Pulse 61    Ht 5\' 6"  (1.676 m)    Wt 125 lb (56.7 kg)    BMI 20.18 kg/m  Last Weight:  Wt Readings from Last 1 Encounters:  04/01/21 125 lb (56.7 kg)   Last Height:   Ht Readings from Last 1 Encounters:  04/01/21 5\' 6"  (1.676 m)  Exam: NAD, pleasant                  Speech:    Speech is normal; fluent and spontaneous with normal comprehension.  Cognition:    The patient is oriented to person, place, and time;     recent and remote memory intact;     language fluent;    Cranial Nerves:    The pupils are equal, round, and reactive to light.Trigeminal sensation is intact and the muscles of mastication are normal. The face is symmetric. The palate elevates in the midline. Hearing intact. Voice is normal. Shoulder shrug is normal. The tongue has normal motion without fasciculations.   Coordination:  No dysmetria  Motor Observation:    No asymmetry, no atrophy, and no involuntary movements noted. Tone:    Normal muscle tone.     Strength:    Strength is V/V in the upper and lower  limbs.      Sensation: intact to LT    Assessment/Plan:  65 year old patient with intractable chronic migraines without aura. FAILED CGRP(Aimovig, Emgality, AAjovy), Botox and almost every medication for migraines that I can think of. Next could try memantine.    - I called in some lidocaine nasal spray for patient, knows how to do this please discuss with her, patient would like a called into Sam at [facility], they will have to compound it.  This may help with migraines acutely, may also continue Nurtec acutely, may also help with burning mouth syndrome via the sphenopalatine ganglion.  - we also discussed lots of stress in her work, we discussed maybe trying memantine in the future, she has tried and failed so many different medications, including all the CGRP's, we talked about Vyepti, she also failed Botox.  At this time the next thing I would try is memantine or Vyepti.  - Burning mouth - sphenopalatine ganglion block, compound lidocaine nasal spray  - Patient with chronic migraines, doing well on Lamictal over the last 6 months with 4-7 migraine daya a month.  She had a very bad several months when they took her off food well brand and put her on generic but now she is back on Vivelle brand.  We have had a long discussion in the past that women with migraine with aura have increased risk of stroke and have increased risk of at least 30% on hormone replacement therapy she understands the risks.  - Continue on current regimen of atenolol, Celexa, Lamictal, Zofran, Nurtec, for severe migraines I do give her hydrocodone that she uses rarely, she is on an estradiol patch and pays for brand we were unable to get that approved for the prior Auth.  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 Generic failed(tried increasing generic dose as well) and does not tolerate generic with side effects, needs BRAND only.  Again we  have discussed  increased risks of stroke.   Naomie Dean, MD  [facility] [address] [place], [place] [address]  Phone [phone] Fax [phone]  I spent over 50 minutes of face-to-face and non-face-to-face time with patient on the  1. Chronic migraine without aura, with intractable migraine, so stated, with status migrainosus   2. Burning mouth syndrome   3. Migraine with aura and with status migrainosus, not intractable     diagnosis.  This included previsit chart review, lab review, study review, order entry, electronic health record documentation, patient education on the different diagnostic and therapeutic options, counseling and coordination of care, risks and benefits of management, compliance, or risk factor reduction

## 2021-04-07 ENCOUNTER — Other Ambulatory Visit: Payer: Self-pay | Admitting: Neurology

## 2021-04-07 DIAGNOSIS — K146 Glossodynia: Secondary | ICD-10-CM

## 2021-04-07 MED ORDER — CLONAZEPAM 1 MG PO TABS
ORAL_TABLET | ORAL | 1 refills | Status: DC
Start: 2021-04-07 — End: 2021-11-20

## 2021-04-12 ENCOUNTER — Encounter: Payer: Self-pay | Admitting: Neurology

## 2021-04-12 DIAGNOSIS — G43711 Chronic migraine without aura, intractable, with status migrainosus: Secondary | ICD-10-CM | POA: Insufficient documentation

## 2021-04-12 DIAGNOSIS — K146 Glossodynia: Secondary | ICD-10-CM | POA: Insufficient documentation

## 2021-04-15 ENCOUNTER — Other Ambulatory Visit: Payer: Self-pay | Admitting: *Deleted

## 2021-04-15 MED ORDER — SALINE NASAL SPRAY 0.65 % NA SOLN: NASAL | 0 refills | Status: DC

## 2021-06-01 ENCOUNTER — Encounter: Payer: Self-pay | Admitting: Neurology

## 2021-06-01 ENCOUNTER — Ambulatory Visit (INDEPENDENT_AMBULATORY_CARE_PROVIDER_SITE_OTHER): Payer: Medicare Other | Admitting: Neurology

## 2021-06-01 ENCOUNTER — Telehealth: Payer: Self-pay | Admitting: *Deleted

## 2021-06-01 DIAGNOSIS — G43711 Chronic migraine without aura, intractable, with status migrainosus: Secondary | ICD-10-CM | POA: Diagnosis not present

## 2021-06-01 DIAGNOSIS — G43901 Migraine, unspecified, not intractable, with status migrainosus: Secondary | ICD-10-CM | POA: Insufficient documentation

## 2021-06-01 MED ORDER — PROMETHAZINE HCL 25 MG PO TABS: 25.0000 mg | ORAL_TABLET | Freq: Four times a day (QID) | ORAL | 11 refills | Status: DC | PRN

## 2021-06-01 MED ORDER — ACETAMINOPHEN-CODEINE #3 300-30 MG PO TABS: 1.0000 | ORAL_TABLET | ORAL | 1 refills | Status: DC | PRN

## 2021-06-01 MED ORDER — REYVOW 100 MG PO TABS: 100.0000 mg | ORAL_TABLET | Freq: Every day | ORAL | 0 refills | Status: DC | PRN

## 2021-06-01 MED ORDER — PROCHLORPERAZINE EDISYLATE 10 MG/2ML IJ SOLN
10.0000 mg | Freq: Once | INTRAMUSCULAR | Status: AC
  Administered 2021-06-01: 10 mg via INTRAMUSCULAR

## 2021-06-01 NOTE — Progress Notes (Addendum)
Patient here with excruciating headache, status migrainosus x 4 days. We discussed other options, performed nerve blocks.  > 15 migraines a day for 6 months, no medicaton overuse, no aura  -Tylenol with codeine as a last option for status migainosus prn as infrequently as possible  - Lidocaine nasal spray - send to [facility], Intranasal lidocaine 4% spray into the nostril on the side of the headache, or bilaterally for bilateral headache. After blowing your nose to clear your nostrils tilt the head back and insert nasal applicator into affected side nostril while blocking the other nostril with your finger. Press down on the pump while breathing deeply through the nostril. If the headache is bilateral (both sides of the head) apply spray into both nostrils. If it is unilateral (one side of the head) it is sprayed into the nostril of the affected side. Can repeat in 15 minutes. No more than 6 sprays a day.   - Phenergan tabs - Nurtec as needed - Can try Lasmiditan (Reyvow) do not drive for 8 hours after taking - compazine IM today  Performed by Dr. Lucia Gaskins M.D.. All procedures a documented below were medically necessary, reasonable and appropriate based on the patient's history, medical diagnosis and physician opinion. Verbal informed consent was obtained from the patient, patient was informed of potential risk of procedure, including bruising, bleeding, hematoma formation, infection, muscle weakness, muscle pain, numbness, transient hypertension, transient hyperglycemia and transient insomnia among others. All areas injected were topically clean with isopropyl rubbing alcohol. Nonsterile nonlatex gloves were worn during the procedure.  1. Greater occipital nerve block [phone]). The greater occipital nerve site was identified at the nuchal line medial to the occipital artery. Medication was injected into the left and right occipital nerve areas and suboccipital areas. Patient's condition is associated  with inflammation of the greater occipital nerve and associated multiple groups. Injection was deemed medically necessary, reasonable and appropriate. Injection represents a separate and unique surgical service.  2. Lesser occipital nerve block [phone]). The lesser occipital nerve site was identified approximately 2 cm lateral to the greater occipital nerve. Occasion was injected into the left and right occipital nerve areas. Patient's condition is associated with inflammation of the lesser occipital nerve and associated muscle groups. Injection was deemed medically necessary, reasonable and appropriate. Injection represents a separate and unique surgical service.   3. Auriculotemporal nerve block (67209): The Auriculotemporal nerve site was identified along the posterior margin of the sternocleidomastoid muscle toward the base of the ear. Medication was injected into the left and right radicular temporal nerve areas. Patient's condition is associated with inflammation of the Auriculotemporal Nerve and associated muscle groups. Injection was deemed medically necessary, reasonable and appropriate. Injection represents a separate and unique surgical service.  4. Supraorbital nerve block (64400): Supraorbital nerve site was identified along the incision of the frontal bone on the orbital/supraorbital ridge. Medication was injected into the left and right supraorbital nerve areas. Patient's condition is associated with inflammation of the supraorbital and associated muscle groups. Injection was deemed medically necessary, reasonable and appropriate. Injection represents a separate and unique surgical service.  Meds ordered this encounter  Medications   prochlorperazine (COMPAZINE) injection 10 mg   Lasmiditan Succinate (REYVOW) 100 MG TABS    Sig: Take 100 mg by mouth daily as needed. Do not drive for 8 hours, may cause sedation    Dispense:  4 tablet    Refill:  0   acetaminophen-codeine (TYLENOL #3)  300-30 MG tablet  Sig: Take 1 tablet by mouth every 4 (four) hours as needed for moderate pain.    Dispense:  30 tablet    Refill:  1   promethazine (PHENERGAN) 25 MG tablet    Sig: Take 1 tablet (25 mg total) by mouth every 6 (six) hours as needed for nausea or vomiting.    Dispense:  30 tablet    Refill:  11    I spent over 45 minutes of face-to-face and non-face-to-face time with patient on the  1. Status migrainosus   2. Chronic migraine without aura, with intractable migraine, so stated, with status migrainosus    diagnosis.  This included previsit chart review, lab review, study review, order entry, electronic health record documentation, patient education on the different diagnostic and therapeutic options, counseling and coordination of care, risks and benefits of management, compliance, or risk factor reduction

## 2021-06-01 NOTE — Telephone Encounter (Signed)
I called [facility] they do not compound.  I called [facility] previously and they tried to contact pt as well as I with mychart.  Once she calls them they may mail it (not positive on that).

## 2021-06-01 NOTE — Telephone Encounter (Signed)
-----   Message from Anson Fret, MD sent at 06/01/2021 12:49 PM EST ----- Regarding: lidocaine spray Can someone call [facility] pharmacy and see if they can compound this:  - Lidocaine nasal spray - send to [facility], Intranasal lidocaine 4% spray(or 2% if the don't have 4%) into the nostril on the side of the headache, or bilaterally for bilateral headache. After blowing your nose to clear your nostrils tilt the head back and insert nasal applicator into affected side nostril while blocking the other nostril with your finger. Press down on the pump while breathing deeply through the nostril. If the headache is bilateral (both sides of the head) apply spray into both nostrils. If it is unilateral (one side of the head) it is sprayed into the nostril of the affected side. Can repeat in 15 minutes. No more than 6 sprays a day.

## 2021-06-01 NOTE — Progress Notes (Signed)
Sensorcaine 0.5% 104mL Lot: [id number] Exp: 01/26 NDC 27782-423-53

## 2021-06-01 NOTE — Telephone Encounter (Signed)
-----   Message from Antonia B Ahern, MD sent at 06/01/2021 12:49 PM EST ----- °Regarding: lidocaine spray °Can someone call [facility] and see if they can compound this: ° °- Lidocaine nasal spray - send to [facility], Intranasal lidocaine 4% spray(or 2% if the don't have 4%) into the nostril on the side of the headache, or bilaterally for bilateral headache. After blowing your nose to clear your nostrils tilt the head back and insert nasal applicator into affected side nostril while blocking the other nostril with your finger. Press down on the pump while breathing deeply through the nostril. If the headache is bilateral (both sides of the head) apply spray into both nostrils. If it is unilateral (one side of the head) it is sprayed into the nostril of the affected side. Can repeat in 15 minutes. No more than 6 sprays a day.  ° °

## 2021-06-01 NOTE — Progress Notes (Signed)
10:30 AM per v.o., under aseptic technique, injected Prochlorperazine 10 mg/2 mL IM into LUOQ of L buttock. Pt tolerated well. No bleeding Bandaid applied. Pt with husband.

## 2021-06-01 NOTE — Patient Instructions (Addendum)
Lidocaine nasal spray - send to [facility], Intranasal lidocaine 4% spray into the nostril on the side of the headache, or bilaterally for bilateral headache. After blowing your nose to clear your nostrils tilt the head back and insert nasal applicator into affected side nostril while blocking the other nostril with your finger. Press down on the pump while breathing deeply through the nostril. If the headache is bilateral (both sides of the head) apply spray into both nostrils. If it is unilateral (one side of the head) it is sprayed into the nostril of the affected side. Can repeat in 15 minutes. No more than 6 sprays a day.   Tylenol with codeine Phenergan tabs Nurtec as needed Can try Lasmiditan (Reyvow) do not drive for 8 hours after taking  Meds ordered this encounter  Medications   prochlorperazine (COMPAZINE) injection 10 mg   Lasmiditan Succinate (REYVOW) 100 MG TABS    Sig: Take 100 mg by mouth daily as needed. Do not drive for 8 hours, may cause sedation    Dispense:  4 tablet    Refill:  0   acetaminophen-codeine (TYLENOL #3) 300-30 MG tablet    Sig: Take 1 tablet by mouth every 4 (four) hours as needed for moderate pain.    Dispense:  30 tablet    Refill:  1   promethazine (PHENERGAN) 25 MG tablet    Sig: Take 1 tablet (25 mg total) by mouth every 6 (six) hours as needed for nausea or vomiting.    Dispense:  30 tablet    Refill:  11     Acetaminophen; Codeine Solution What is this medication? ACETAMINOPHEN; CODEINE (a set a MEE noe fen; KOE deen) treats moderate pain. It is prescribed when other pain medications have not worked or cannot be tolerated. It works by blocking pain signals in the brain. This medication is a combination of acetaminophen and an opioid. This medicine may be used for other purposes; ask your health care provider or pharmacist if you have questions. COMMON BRAND NAME(S): TY-PAP with Codeine, Tylenol with Codeine What should I tell my care team before  I take this medication? They need to know if you have any of these conditions: Brain tumor Drug abuse or addiction Head injury Heart disease If you often drink alcohol Kidney disease Liver disease Low adrenal gland function Lung disease, asthma, or breathing problems Seizures Stomach or intestine problems Taken an MAOI like Marplan, Nardil, or Parnate in the last 14 days An unusual or allergic reaction to acetaminophen, codeine, other medications, foods, dyes, or preservatives Pregnant or trying to get pregnant Breast-feeding How should I use this medication? Take this medication by mouth. Follow the directions on the prescription label. Use a specially marked spoon or container to measure each dose. Ask your pharmacist if you do not have one. Household spoons are not accurate. If the medication upsets your stomach, take it with food. Do not take your medication more often than directed. A special MedGuide will be given to you by the pharmacist with each prescription and refill. Be sure to read this information carefully each time. Talk to your care team regarding the use of this medication in children. This medication is not for use in children less than 9 years of age. Do not give this medication to a child younger than 53 years of age after surgery to remove the tonsils and/or adenoids. Overdosage: If you think you have taken too much of this medicine contact a poison control center  or emergency room at once. NOTE: This medicine is only for you. Do not share this medicine with others. What if I miss a dose? If you miss a dose, take it as soon as you can. If it is almost time for your next dose, take only that dose. Do not take double or extra doses. What may interact with this medication? Do not take this medication with any of the following: Linezolid MAOIs like Marplan, Nardil, and Parnate Methylene blue Ozanimod Samidorphan This medication may also interact with the  following: Alcohol Amiodarone Antihistamines for allergy, cough, and cold Atropine Certain antibiotics like clarithromycin, erythromycin, rifampin Certain antivirals for HIV or hepatitis Certain medications for anxiety or sleep Certain medications for bladder problems like oxybutynin, tolterodine Certain medications for depression like amitriptyline, bupropion, fluoxetine, paroxetine, sertraline, mirtazapine, trazodone Certain medications for fungal infections like ketoconazole, itraconazole, and posaconazole Certain medications for migraine headache like almotriptan, eletriptan, frovatriptan, naratriptan, rizatriptan, sumatriptan, zolmitriptan Certain medications for nausea or vomiting like dolasetron, granisetron, ondansetron, palonosetron Certain medications for Parkinson's disease like benztropine, trihexyphenidyl Certain medications for seizures like carbamazepine, phenobarbital, phenytoin, primidone Certain medications for stomach problems like dicyclomine, hyoscyamine Certain medications for travel sickness like scopolamine Diuretics General anesthetics like halothane, isoflurane, methoxyflurane, propofol Ipratropium Medications that relax muscles Other medications with acetaminophen Other narcotic medications for pain or cough Phenothiazines like chlorpromazine, mesoridazine, prochlorperazine, thioridazine Quinidine This list may not describe all possible interactions. Give your health care provider a list of all the medicines, herbs, non-prescription drugs, or dietary supplements you use. Also tell them if you smoke, drink alcohol, or use illegal drugs. Some items may interact with your medicine. What should I watch for while using this medication? Tell your care team if your pain does not go away, if it gets worse, or if you have new or a different type of pain. You may develop tolerance to this medication. Tolerance means that you will need a higher dose of the medication for  pain relief. Tolerance is normal and is expected if you take this medication for a long time. There are different types of narcotic medications (opioids) for pain. If you take more than one type at the same time, you may have more side effects. Give your care team a list of all medications you use. They will tell you how much medication to take. Do not take more medication than directed. Call emergency services right away if you have problems breathing. Do not suddenly stop taking your medication because you may develop a severe reaction. Your body becomes used to the medication. This does NOT mean you are addicted. Addiction is a behavior related to getting and using a medication for a nonmedical reason. If you have pain, you have a medical reason to take pain medication. Your care team will tell you how much medication to take. If your care team wants you to stop the medication, the dose will be slowly lowered over time to avoid any side effects. Talk to your care team about naloxone and how to get it. Naloxone is an emergency medication used for an opioid overdose. An overdose can happen if you take too much opioid. It can also happen if an opioid is taken with some other medications or substances, like alcohol. Know the symptoms of an overdose, like trouble breathing, unusually tired or sleepy, or not being able to respond or wake up. Make sure to tell caregivers and close contacts where it is stored. Make sure they know how to use  it. After naloxone is given, you must call emergency services. Naloxone is a temporary treatment. Repeat doses may be needed. Children may be at higher risk for side effects. If your child has slow breathing, noisy breathing, confusion, or unusual sleepiness, stop giving this medication and call emergency services. Do not take other medications that contain acetaminophen with this medication. Many non-prescription medications contain acetaminophen. Always read labels carefully.  If you have questions, ask your care team. If you take too much acetaminophen, get medical help right away. Too much acetaminophen can be very dangerous and cause liver damage. Even if you do not have symptoms, it is important to get help right away. You may get drowsy or dizzy. Do not drive, use machinery, or do anything that needs mental alertness until you know how this medication affects you. Do not stand up or sit up quickly, especially if you are an older patient. This reduces the risk of dizzy or fainting spells. Alcohol may interfere with the effect of this medication. Avoid alcoholic drinks. This medication will cause constipation. If you do not have a bowel movement for 3 days, call your care team. Your mouth may get dry. Chewing sugarless gum or sucking hard candy and drinking plenty of water may help. Contact your care team if the problem does not go away or is severe. What side effects may I notice from receiving this medication? Side effects that you should report to your care team as soon as possible: Allergic reactions--skin rash, itching, hives, swelling of the face, lips, tongue, or throat CNS depression--slow or shallow breathing, shortness of breath, feeling faint, dizziness, confusion, trouble staying awake Liver injury--right upper belly pain, loss of appetite, nausea, light-colored stool, dark yellow or brown urine, yellowing skin or eyes, unusual weakness or fatigue Low adrenal gland function--nausea, vomiting, loss of appetite, unusual weakness or fatigue, dizziness Low blood pressure--dizziness, feeling faint or lightheaded, blurry vision Redness, blistering, peeling, or loosening of the skin, including inside the mouth Side effects that usually do not require medical attention (report to your care team if they continue or are bothersome): Constipation Dizziness Drowsiness Dry mouth Headache Nausea Trouble sleeping Upset stomach Vomiting This list may not describe all  possible side effects. Call your doctor for medical advice about side effects. You may report side effects to FDA at 1-800-FDA-1088. Where should I keep my medication? Keep out of the reach of children and pets. This medication can be abused. Keep your medication in a safe place to protect it from theft. Do not share this medication with anyone. Selling or giving away this medication is dangerous and against the law. Store at room temperature between 15 and 30 degrees C (59 and 86 degrees F). This medication may cause accidental overdose and death if taken by other adults, children, or pets. Mix any unused medication with a substance like cat litter or coffee grounds. Then throw the medication away in a sealed container like a sealed bag or a coffee can with a lid. Do not use the medication after the expiration date. NOTE: This sheet is a summary. It may not cover all possible information. If you have questions about this medicine, talk to your doctor, pharmacist, or health care provider.  2022 Elsevier/Gold Standard (2020-04-17 00:00:00)   Lasmiditan Tablets What is this medication? LASMIDITAN (las MID i tan) treats migraines. It works by blocking pain signals in the brain. It is not used to prevent migraines. This medicine may be used for other purposes; ask your  health care provider or pharmacist if you have questions. COMMON BRAND NAME(S): REYVOW What should I tell my care team before I take this medication? They need to know if you have any of these conditions: Heart disease Liver disease An unusual or allergic reaction to lasmiditan, other medications, foods, dyes, or preservatives Pregnant or trying to get pregnant Breast-feeding How should I use this medication? Take this medication by mouth with water. Take it as directed on the label. Do not cut, crush or chew this medication. Swallow the tablets whole. You can take it with or without food. If it upsets your stomach, take it with  food. Do not use it more often than directed. A special MedGuide will be given to you by the pharmacist with each prescription and refill. Be sure to read this information carefully each time. Talk to your care team about the use of this medication in children. Special care may be needed. Overdosage: If you think you have taken too much of this medicine contact a poison control center or emergency room at once. NOTE: This medicine is only for you. Do not share this medicine with others. What if I miss a dose? This does not apply. This medication is not for regular use. It should only be used as needed. What may interact with this medication? This medication may interact with the following: Alcohol Antihistamines for allergy, cough, and cold Certain medications for anxiety or sleep Certain medications for blood pressure, heart disease, irregular heart beat Certain medications for depression, anxiety, or psychotic disorders Certain medications for seizures like phenobarbital, primidone Dextromethorphan General anesthetics like halothane, isoflurane, methoxyflurane, propofol Local anesthetics like lidocaine, pramoxine, tetracaine Medications that relax muscles for surgery Narcotic medications for pain Phenothiazines like chlorpromazine, mesoridazine, prochlorperazine, thioridazine St. John's Wort This list may not describe all possible interactions. Give your health care provider a list of all the medicines, herbs, non-prescription drugs, or dietary supplements you use. Also tell them if you smoke, drink alcohol, or use illegal drugs. Some items may interact with your medicine. What should I watch for while using this medication? Visit your care team for regular checks on your progress. Tell your care team if your symptoms do not start to get better or if they get worse. You may get drowsy or dizzy. Do not drive, use machinery, or do anything that needs mental alertness until you know how this  medication affects you. Do not stand up or sit up quickly, especially if you are an older patient. This reduces the risk of dizzy or fainting spells. Alcohol may interfere with the effect of this medication. Avoid alcoholic drinks. Tell your care team right away if you have any change in your eyesight. What side effects may I notice from receiving this medication? Side effects that you should report to your care team as soon as possible: Allergic reactions--skin rash, itching, hives, swelling of the face, lips, tongue, or throat Irritability, confusion, fast or irregular heartbeat, muscle stiffness, twitching muscles, sweating, high fever, seizure, chills, vomiting, diarrhea, which may be signs of serotonin syndrome Side effects that usually do not require medical attention (report these to your care team if they continue or are bothersome): Burning or tingling sensation in the hands or feet Dizziness Drowsiness Fatigue This list may not describe all possible side effects. Call your doctor for medical advice about side effects. You may report side effects to FDA at 1-800-FDA-1088. Where should I keep my medication? Keep out of the reach of children and  pets. This medication can be abused. Keep it in a safe place to protect it from theft. Do not share it with anyone. It is only for you. Selling or giving away this medication is dangerous and against the law. Store at room temperature between 20 and 25 degrees C (68 and 77 degrees F). Get rid of any unused medication after the expiration date. This medication may cause harm and death if it is taken by other adults, children, or pets. It is important to get rid of the medication as soon as you no longer need it, or it is expired. You can do this in two ways: Take the medication to a medication take-back program. Check with your pharmacy or law enforcement to find a location. If you cannot return the medication, check the label or package insert to see  if the medication should be thrown out in the garbage or flushed down the toilet. If you are not sure, ask your care team. If it is safe to put it in the trash, take the medication out of the container. Mix the medication with cat litter, dirt, coffee grounds, or other unwanted substance. Seal the mixture in a bag or container. Put it in the trash. NOTE: This sheet is a summary. It may not cover all possible information. If you have questions about this medicine, talk to your doctor, pharmacist, or health care provider.  2022 Elsevier/Gold Standard (2020-10-23 00:00:00)

## 2021-06-02 NOTE — Telephone Encounter (Addendum)
Dr Lucia Gaskins did text pt and let her know she will need to contact [facility] [employer].  I did relay there was prescription in med list relating 04-15-2021 order that was escribed then (normal saline/lidocaine 4 %). To [facility].  Dr. Lucia Gaskins said that was fine.

## 2021-08-03 ENCOUNTER — Telehealth: Payer: Self-pay | Admitting: *Deleted

## 2021-08-03 NOTE — Telephone Encounter (Signed)
Completed Nurtec PA on Cover My Meds. Key: [id number]. Awaiting determination from [facility].  ?

## 2021-08-04 NOTE — Telephone Encounter (Signed)
Received approval from [place]. Nurtec approved #[id number] from 04/19/21-08/03/22. Approval letter faxed to pharmacy. Received a receipt of confirmation. ? ?

## 2021-10-12 ENCOUNTER — Other Ambulatory Visit: Payer: Self-pay | Admitting: Neurology

## 2021-10-12 DIAGNOSIS — G43001 Migraine without aura, not intractable, with status migrainosus: Secondary | ICD-10-CM

## 2021-10-12 MED ORDER — NURTEC 75 MG PO TBDP: 75.0000 mg | ORAL_TABLET | ORAL | 11 refills | Status: DC

## 2021-10-13 ENCOUNTER — Other Ambulatory Visit: Payer: Self-pay | Admitting: Neurology

## 2021-10-15 ENCOUNTER — Other Ambulatory Visit: Payer: Self-pay | Admitting: *Deleted

## 2021-10-15 MED ORDER — ONDANSETRON 4 MG PO TBDP: 4.0000 mg | ORAL_TABLET | Freq: Three times a day (TID) | ORAL | 11 refills | Status: DC | PRN

## 2021-10-28 ENCOUNTER — Encounter: Payer: Self-pay | Admitting: Neurology

## 2021-10-28 ENCOUNTER — Ambulatory Visit (INDEPENDENT_AMBULATORY_CARE_PROVIDER_SITE_OTHER): Payer: Medicare Other | Admitting: Neurology

## 2021-10-28 DIAGNOSIS — G43711 Chronic migraine without aura, intractable, with status migrainosus: Secondary | ICD-10-CM

## 2021-10-28 NOTE — Progress Notes (Signed)
> 15 migraines a day for 6 months, lasts 18 hours a day at least, severe, no medicaton overuse, no aura > 6 months try vyepti has tried ajovy, emgality, aimovig and multiple other medications. Start vyepti. No medications overuse. With and without aura.  Medications tried: Tylenol, Excedrin, atenolol, baclofen, Botox x3, Ubrelvy, trokendi, tramadol, nurtec, oxycodone, ondansetron, reglan, medrol dosepak, lamictal, Emgality, Aimovig, ajovy, hydrocodone, cannot tolerate triptans, reglan, phenergan, nurtec, ubrelvy, trokendi, effexor Ajovy, effexor, cymbalta, pristiq,celexa, Lamictal, botox,baclofen,atenolol, the dot changed to generic, nortriptyline, amitriptyline, topiramate, Zofran, Trokendi, axert, cafergot, DHE-45, imitrex tablet/injection, maxalt, midrin. She tells me she does not tolerate triptans well. Lamictal, Qulipta, Vivelle dot, Try elyxyb and reyvow acutely, prpopranolol, depakote, carveilol, verapamil  Patient here with excruciating headache, status migrainosus x 4 days. We discussed other options, performed nerve blocks.  -Tylenol with codeine as a last option for status migainosus prn as infrequently as possible  - Lidocaine nasal spray - send to [facility], Intranasal lidocaine 4% spray into the nostril on the side of the headache, or bilaterally for bilateral headache. After blowing your nose to clear your nostrils tilt the head back and insert nasal applicator into affected side nostril while blocking the other nostril with your finger. Press down on the pump while breathing deeply through the nostril. If the headache is bilateral (both sides of the head) apply spray into both nostrils. If it is unilateral (one side of the head) it is sprayed into the nostril of the affected side. Can repeat in 15 minutes. No more than 6 sprays a day.    Performed by Dr. Lucia Gaskins M.D.. All procedures a documented below were medically necessary, reasonable and appropriate based on the patient's history,  medical diagnosis and physician opinion. Verbal informed consent was obtained from the patient, patient was informed of potential risk of procedure, including bruising, bleeding, hematoma formation, infection, muscle weakness, muscle pain, numbness, transient hypertension, transient hyperglycemia and transient insomnia among others. All areas injected were topically clean with isopropyl rubbing alcohol. Nonsterile nonlatex gloves were worn during the procedure.  1. Greater occipital nerve block [phone]). The greater occipital nerve site was identified at the nuchal line medial to the occipital artery. Medication was injected into the left and right occipital nerve areas and suboccipital areas. Patient's condition is associated with inflammation of the greater occipital nerve and associated multiple groups. Injection was deemed medically necessary, reasonable and appropriate. Injection represents a separate and unique surgical service.  2. Lesser occipital nerve block [phone]). The lesser occipital nerve site was identified approximately 2 cm lateral to the greater occipital nerve. Occasion was injected into the left and right occipital nerve areas. Patient's condition is associated with inflammation of the lesser occipital nerve and associated muscle groups. Injection was deemed medically necessary, reasonable and appropriate. Injection represents a separate and unique surgical service.   3. Auriculotemporal nerve block ([id number]): The Auriculotemporal nerve site was identified along the posterior margin of the sternocleidomastoid muscle toward the base of the ear. Medication was injected into the left and right radicular temporal nerve areas. Patient's condition is associated with inflammation of the Auriculotemporal Nerve and associated muscle groups. Injection was deemed medically necessary, reasonable and appropriate. Injection represents a separate and unique surgical service.  4. Supraorbital nerve block  ([id number]): Supraorbital nerve site was identified along the incision of the frontal bone on the orbital/supraorbital ridge. Medication was injected into the left and right supraorbital nerve areas. Patient's condition is associated with inflammation of the supraorbital and associated muscle  groups. Injection was deemed medically necessary, reasonable and appropriate. Injection represents a separate and unique surgical service.   I spent over 45 minutes of face-to-face and non-face-to-face time with patient on the  1. Chronic migraine without aura, intractable, with status migrainosus     diagnosis.  This included previsit chart review, lab review, study review, order entry, electronic health record documentation, patient education on the different diagnostic and therapeutic options, counseling and coordination of care, risks and benefits of management, compliance, or risk factor reduction

## 2021-10-28 NOTE — Progress Notes (Signed)
Sensorcaine 0.5% 63mL Lot: [id number] Exp: 01/26 NDC 82423-536-14 G43.901

## 2021-10-31 ENCOUNTER — Other Ambulatory Visit: Payer: Self-pay | Admitting: Neurology

## 2021-11-04 MED ORDER — BUPIVACAINE HCL (PF) 0.5 % IJ SOLN: 36.0000 mL | Freq: Once | INTRAMUSCULAR | Status: DC

## 2021-11-04 NOTE — Progress Notes (Signed)
Senorcaine 0.5% Bupivacine. NDC 45038-882-80, lot [id number], exp 04/2024  41ml.

## 2021-11-05 ENCOUNTER — Ambulatory Visit (INDEPENDENT_AMBULATORY_CARE_PROVIDER_SITE_OTHER): Payer: Medicare Other | Admitting: Neurology

## 2021-11-05 ENCOUNTER — Telehealth: Payer: Self-pay

## 2021-11-05 DIAGNOSIS — G43711 Chronic migraine without aura, intractable, with status migrainosus: Secondary | ICD-10-CM

## 2021-11-05 DIAGNOSIS — G43001 Migraine without aura, not intractable, with status migrainosus: Secondary | ICD-10-CM

## 2021-11-05 NOTE — Patient Instructions (Signed)
> 15 migraines a day for 6 months, lasts 18 hours a day at least, severe, no medicaton overuse, no aura > 6 months try vyepti has tried ajovy, emgality, aimovig and multiple other medications. Start vyepti. No medications overuse. With and without aura.  Medications tried: Tylenol, Excedrin, atenolol, baclofen, Botox x3, Ubrelvy, trokendi, tramadol, nurtec, oxycodone, ondansetron, reglan, medrol dosepak, lamictal, Emgality, Aimovig, ajovy, hydrocodone, cannot tolerate triptans, reglan, phenergan, nurtec, ubrelvy, trokendi, effexor Ajovy, effexor, cymbalta, pristiq,celexa, Lamictal, botox,baclofen,atenolol, the dot changed to generic, nortriptyline, amitriptyline, topiramate, Zofran, Trokendi, axert, cafergot, DHE-45, imitrex tablet/injection, maxalt, midrin. She tells me she does not tolerate triptans well. Lamictal, Qulipta, Vivelle dot, Try elyxyb and reyvow acutely, prpopranolol, depakote, carveilol, verapamil  Patient here with excruciating headache, status migrainosus x 4 days. We discussed other options, performed nerve blocks.  -Tylenol with codeine as a last option for status migainosus prn as infrequently as possible  - Lidocaine nasal spray - send to [facility], Intranasal lidocaine 4% spray into the nostril on the side of the headache, or bilaterally for bilateral headache. After blowing your nose to clear your nostrils tilt the head back and insert nasal applicator into affected side nostril while blocking the other nostril with your finger. Press down on the pump while breathing deeply through the nostril. If the headache is bilateral (both sides of the head) apply spray into both nostrils. If it is unilateral (one side of the head) it is sprayed into the nostril of the affected side. Can repeat in 15 minutes. No more than 6 sprays a day.    Performed by Dr. Lucia Gaskins M.D.. All procedures a documented below were medically necessary, reasonable and appropriate based on the patient's history,  medical diagnosis and physician opinion. Verbal informed consent was obtained from the patient, patient was informed of potential risk of procedure, including bruising, bleeding, hematoma formation, infection, muscle weakness, muscle pain, numbness, transient hypertension, transient hyperglycemia and transient insomnia among others. All areas injected were topically clean with isopropyl rubbing alcohol. Nonsterile nonlatex gloves were worn during the procedure.  1. Greater occipital nerve block [phone]). The greater occipital nerve site was identified at the nuchal line medial to the occipital artery. Medication was injected into the left and right occipital nerve areas and suboccipital areas. Patient's condition is associated with inflammation of the greater occipital nerve and associated multiple groups. Injection was deemed medically necessary, reasonable and appropriate. Injection represents a separate and unique surgical service.  2. Lesser occipital nerve block [phone]). The lesser occipital nerve site was identified approximately 2 cm lateral to the greater occipital nerve. Occasion was injected into the left and right occipital nerve areas. Patient's condition is associated with inflammation of the lesser occipital nerve and associated muscle groups. Injection was deemed medically necessary, reasonable and appropriate. Injection represents a separate and unique surgical service.   3. Auriculotemporal nerve block ([id number]): The Auriculotemporal nerve site was identified along the posterior margin of the sternocleidomastoid muscle toward the base of the ear. Medication was injected into the left and right radicular temporal nerve areas. Patient's condition is associated with inflammation of the Auriculotemporal Nerve and associated muscle groups. Injection was deemed medically necessary, reasonable and appropriate. Injection represents a separate and unique surgical service.  4. Supraorbital nerve block  ([id number]): Supraorbital nerve site was identified along the incision of the frontal bone on the orbital/supraorbital ridge. Medication was injected into the left and right supraorbital nerve areas. Patient's condition is associated with inflammation of the supraorbital and associated muscle  groups. Injection was deemed medically necessary, reasonable and appropriate. Injection represents a separate and unique surgical service.   I spent over 45 minutes of face-to-face and non-face-to-face time with patient on the  1. Chronic migraine without aura, intractable, with status migrainosus   2. Migraine without aura and with status migrainosus, not intractable     diagnosis.  This included previsit chart review, lab review, study review, order entry, electronic health record documentation, patient education on the different diagnostic and therapeutic options, counseling and coordination of care, risks and benefits of management, compliance, or risk factor reduction

## 2021-11-05 NOTE — Telephone Encounter (Signed)
Order for vyepti was provided to [facility] with [facility] on 10/28/2021 and they are still working on the prior authorization. She will keep [place] up to date on this.

## 2021-11-05 NOTE — Telephone Encounter (Signed)
Received message from Dr. Lucia Gaskins stating  [8:42 AM] Michele [name]  can someone check n [employer]'s nurtec approval, call pharmacy and let her know also check on her vyepti approval please  I called [facility] spoke with Erick and he states PA is till current but pt is in the coverage gap at this time and he price has increased due to this.   I have obtained the order sheet for vyepti from [facility] for MD to sign. They will complete the PA for this med.

## 2021-11-05 NOTE — Progress Notes (Signed)
> 15 migraines a day for 6 months, lasts 18 hours a day at least, severe, no medicaton overuse, no aura > 6 months try vyepti has tried ajovy, emgality, aimovig and multiple other medications. Start vyepti. No medications overuse. With and without aura.  Medications tried: Tylenol, Excedrin, atenolol, baclofen, Botox x3, Ubrelvy, trokendi, tramadol, nurtec, oxycodone, ondansetron, reglan, medrol dosepak, lamictal, Emgality, Aimovig, ajovy, hydrocodone, cannot tolerate triptans, reglan, phenergan, nurtec, ubrelvy, trokendi, effexor Ajovy, effexor, cymbalta, pristiq,celexa, Lamictal, botox,baclofen,atenolol, the dot changed to generic, nortriptyline, amitriptyline, topiramate, Zofran, Trokendi, axert, cafergot, DHE-45, imitrex tablet/injection, maxalt, midrin. She tells me she does not tolerate triptans well. Lamictal, Qulipta, Vivelle dot, Try elyxyb and reyvow acutely, prpopranolol, depakote, carveilol, verapamil  Patient here with excruciating headache, status migrainosus x 2 days. We discussed other options, performed nerve blocks with good succcess pain 8 to 0 today.   -Tylenol with codeine as a last option for status migainosus prn as infrequently as possible  - Lidocaine nasal spray - send to [facility], Intranasal lidocaine 4% spray into the nostril on the side of the headache, or bilaterally for bilateral headache. After blowing your nose to clear your nostrils tilt the head back and insert nasal applicator into affected side nostril while blocking the other nostril with your finger. Press down on the pump while breathing deeply through the nostril. If the headache is bilateral (both sides of the head) apply spray into both nostrils. If it is unilateral (one side of the head) it is sprayed into the nostril of the affected side. Can repeat in 15 minutes. No more than 6 sprays a day.   - gave samples of zevegepant, reyvow, nurtec for prn acute, also looking into he approval for vyepti and  nurtec  Performed by Dr. Lucia Gaskins M.D.. All procedures a documented below were medically necessary, reasonable and appropriate based on the patient's history, medical diagnosis and physician opinion. Verbal informed consent was obtained from the patient, patient was informed of potential risk of procedure, including bruising, bleeding, hematoma formation, infection, muscle weakness, muscle pain, numbness, transient hypertension, transient hyperglycemia and transient insomnia among others. All areas injected were topically clean with isopropyl rubbing alcohol. Nonsterile nonlatex gloves were worn during the procedure.  1. Greater occipital nerve block [phone]). The greater occipital nerve site was identified at the nuchal line medial to the occipital artery. Medication was injected into the left and right occipital nerve areas and suboccipital areas. Patient's condition is associated with inflammation of the greater occipital nerve and associated multiple groups. Injection was deemed medically necessary, reasonable and appropriate. Injection represents a separate and unique surgical service.  2. Lesser occipital nerve block [phone]). The lesser occipital nerve site was identified approximately 2 cm lateral to the greater occipital nerve. Occasion was injected into the left and right occipital nerve areas. Patient's condition is associated with inflammation of the lesser occipital nerve and associated muscle groups. Injection was deemed medically necessary, reasonable and appropriate. Injection represents a separate and unique surgical service.   3. Auriculotemporal nerve block ([id number]): The Auriculotemporal nerve site was identified along the posterior margin of the sternocleidomastoid muscle toward the base of the ear. Medication was injected into the left and right radicular temporal nerve areas. Patient's condition is associated with inflammation of the Auriculotemporal Nerve and associated muscle groups.  Injection was deemed medically necessary, reasonable and appropriate. Injection represents a separate and unique surgical service.  4. Supraorbital nerve block ([id number]): Supraorbital nerve site was identified along the incision of the frontal  bone on the orbital/supraorbital ridge. Medication was injected into the left and right supraorbital nerve areas. Patient's condition is associated with inflammation of the supraorbital and associated muscle groups. Injection was deemed medically necessary, reasonable and appropriate. Injection represents a separate and unique surgical service.   I spent over 30 minutes of face-to-face and non-face-to-face time with patient on the  1. Chronic migraine without aura, intractable, with status migrainosus   2. Migraine without aura and with status migrainosus, not intractable     diagnosis.  This included previsit chart review, lab review, study review, order entry, electronic health record documentation, patient education on the different diagnostic and therapeutic options, counseling and coordination of care, risks and benefits of management, compliance, or risk factor reduction

## 2021-11-19 ENCOUNTER — Encounter: Payer: Self-pay | Admitting: Neurology

## 2021-11-19 ENCOUNTER — Other Ambulatory Visit: Payer: Self-pay | Admitting: Neurology

## 2021-11-20 ENCOUNTER — Other Ambulatory Visit: Payer: Self-pay | Admitting: Neurology

## 2021-11-20 DIAGNOSIS — G43001 Migraine without aura, not intractable, with status migrainosus: Secondary | ICD-10-CM

## 2021-11-20 DIAGNOSIS — K146 Glossodynia: Secondary | ICD-10-CM

## 2021-11-20 MED ORDER — VENLAFAXINE HCL ER 75 MG PO CP24: ORAL_CAPSULE | ORAL | 6 refills | Status: DC

## 2021-11-20 MED ORDER — REYVOW 100 MG PO TABS: 100.0000 mg | ORAL_TABLET | Freq: Every day | ORAL | 11 refills | Status: DC | PRN

## 2021-11-20 MED ORDER — HYDROCODONE-ACETAMINOPHEN 10-325 MG PO TABS: 1.0000 | ORAL_TABLET | ORAL | 0 refills | Status: DC | PRN

## 2021-11-20 MED ORDER — MEMANTINE HCL 10 MG PO TABS: 10.0000 mg | ORAL_TABLET | Freq: Two times a day (BID) | ORAL | 6 refills | Status: DC

## 2021-11-20 MED ORDER — CLONAZEPAM 1 MG PO TABS: ORAL_TABLET | ORAL | 4 refills | Status: DC

## 2021-11-20 MED ORDER — PROCHLORPERAZINE MALEATE 10 MG PO TABS: 10.0000 mg | ORAL_TABLET | Freq: Four times a day (QID) | ORAL | 11 refills | Status: DC | PRN

## 2021-11-20 MED ORDER — ATENOLOL 50 MG PO TABS: 75.0000 mg | ORAL_TABLET | Freq: Every day | ORAL | 4 refills | Status: DC

## 2021-12-12 ENCOUNTER — Other Ambulatory Visit: Payer: Self-pay | Admitting: Neurology

## 2022-01-05 ENCOUNTER — Telehealth: Payer: Self-pay | Admitting: Neurology

## 2022-01-05 NOTE — Telephone Encounter (Addendum)
Dr Jaynee Eagles will provide samples to patient. I called patient and let her know. Pt asked about reyvow. I let her know this was sent to [facility] in [facility] last month. They also have refills of Nurtec there. Patient will call [facility]. She also said she's had 2 migraines since getting Vyepti 100 mg. She's asking if her dose needs to be increased to 300 mg yet?

## 2022-01-05 NOTE — Telephone Encounter (Signed)
Pt would like a call back samples of Nurtec are ready.

## 2022-01-05 NOTE — Telephone Encounter (Signed)
Pt called needing to discuss her Rimegepant Sulfate (NURTEC) 75 MG TBDP samples with RN Please advise.

## 2022-01-21 ENCOUNTER — Telehealth: Payer: Self-pay | Admitting: Neurology

## 2022-01-21 NOTE — Telephone Encounter (Signed)
I called pharmacy and she has refills available.  They will get one ready for her.

## 2022-01-21 NOTE — Telephone Encounter (Signed)
Pharmacy [phone].  Has prescription from 8-/2023 and refills available. Lunch till 1400. Will call later.

## 2022-01-21 NOTE — Telephone Encounter (Signed)
Pt is calling. Reuuesting a refill for Rimegepant Sulfate (NURTEC) 75 MG TBDP.  Refill should be sent to [id number]  .

## 2022-02-16 ENCOUNTER — Other Ambulatory Visit: Payer: Self-pay | Admitting: Neurology

## 2022-05-27 ENCOUNTER — Ambulatory Visit (INDEPENDENT_AMBULATORY_CARE_PROVIDER_SITE_OTHER): Payer: Medicare Other | Admitting: Neurology

## 2022-05-27 DIAGNOSIS — K148 Other diseases of tongue: Secondary | ICD-10-CM | POA: Diagnosis not present

## 2022-05-27 DIAGNOSIS — G43109 Migraine with aura, not intractable, without status migrainosus: Secondary | ICD-10-CM | POA: Diagnosis not present

## 2022-05-27 DIAGNOSIS — G43711 Chronic migraine without aura, intractable, with status migrainosus: Secondary | ICD-10-CM | POA: Diagnosis not present

## 2022-05-27 DIAGNOSIS — G43101 Migraine with aura, not intractable, with status migrainosus: Secondary | ICD-10-CM

## 2022-05-27 DIAGNOSIS — G43001 Migraine without aura, not intractable, with status migrainosus: Secondary | ICD-10-CM

## 2022-05-27 MED ORDER — REYVOW 100 MG PO TABS: 100.0000 mg | ORAL_TABLET | Freq: Every day | ORAL | 11 refills | Status: DC | PRN

## 2022-05-27 MED ORDER — ONDANSETRON 4 MG PO TBDP: 4.0000 mg | ORAL_TABLET | Freq: Three times a day (TID) | ORAL | 11 refills | Status: DC | PRN

## 2022-05-27 MED ORDER — LAMOTRIGINE 100 MG PO TABS
200.0000 mg | ORAL_TABLET | Freq: Two times a day (BID) | ORAL | 11 refills | Status: DC
Start: 2022-05-27 — End: 2023-02-15

## 2022-05-27 MED ORDER — NURTEC 75 MG PO TBDP: 75.0000 mg | ORAL_TABLET | Freq: Every day | ORAL | 11 refills | Status: DC | PRN

## 2022-05-27 MED ORDER — ATENOLOL 50 MG PO TABS
75.0000 mg | ORAL_TABLET | Freq: Every day | ORAL | 4 refills | Status: DC
Start: 2022-05-27 — End: 2023-09-21

## 2022-05-27 MED ORDER — METHYLPREDNISOLONE 4 MG PO TBPK: ORAL_TABLET | ORAL | 1 refills | Status: DC

## 2022-05-27 NOTE — Progress Notes (Addendum)
[facility]    Provider:  Dr Lucia Gaskins Requesting Provider: Rodrigo Ran, MD Primary Care Provider:  Rodrigo Ran, MD  CC:  migraines  01/13/2023: Patient reports that Vyepti 100mg  was initially very helpful but it has become inefective and she still has a burden of migraines monthly of 7 migraine days and 10 total headache days a month and worsening that is affecting her quality of life we will increase vyepti to 300mg  q9months for better efficacy. If we increase Vyepti this will further improve her chronic migraine disorder. She has had >=3 infusions at 100mg .  Nurtec prn  05/27/2022: Incredible response to vyepti; 4 migraines a month and < 10 total headache days a month. The other headaches are more tension across the forehead in the temples may be due to stress. She has a lesion on the right side of her tongue, firm, growing, painful to touch, pain on swallowing, 34mm+ cystic structure that feels firm to touch and radiating throat pain. Go to Dr. Suszanne Conners. She has hard knots on the left traps. Doing great on vyepti. No other focal neurologic deficits, associated symptoms, inciting events or modifiable factors.  Patient complains of symptoms per HPI as well as the following symptoms: stress . Pertinent negatives and positives per HPI. All others negative    04/01/2021:  sam at [facility]. The last 2 months, on vivelle brand, she has been doing better. She has had migraines severe for years, very hormonal, she has had significant auras, migraine with and without aura, sometimes will get migraine aura without headache. She goes most of th emonth without a headache. [name] gave her a hedache every morning. When she gets a migraine she doesn't wake up with it but it may develop very early. Nurtec may work if she catches it very early.  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  At this time the next thing I would try is memantine or Vyepti.Burning mouth - sphenopalatine ganglion block, compound lidocaine nasal spray  Patient complains of symptoms per HPI as well as the following symptoms: stress . Pertinent negatives and positives per HPI. All others negative   11/05/2020: Patient with episodic migraines, doing well on Lamictal over the last 6 months with 4-7 migraine daya a month.  Patient was doing excellent on Lamictal and using Nurtec as needed however recently she has had 2 severe headaches 1 of which we saw her in the office for nerve blocks.  This appears to coincide with her estradiol patch being changed from brand to generic.  We will try and fill out a PA for brand and in the meantime she can wear a higher dose of patch.  We did discuss increased risk of stroke in women with migraine with aura and the risks of increased stroke with estradiol patch.  Medications tried: Tylenol, Excedrin, atenolol, baclofen, Botox x3, Ubrelvy, trokendi, tramadol, nurtec, oxycodone, ondansetron, reglan, medrol dosepak, lamictal, Emgality, Aimovig, hydrocodone, cannot tolerate triptans, reglan, phenergan, nurtec, ubrelvy, trokendi, effexor Ajovy, effexor, cymbalta, pristiq,celexa, Lamictal, botox,baclofen,atenolol, the dot changed to generic, nortriptyline, amitriptyline, topiramate, Zofran, Trokendi, axert, cafergot, DHE-45, imitrex tablet/injection, maxalt, midrin. She tells me she does not tolerate triptans well. Lamictal, Qulipta, Vivelle dot, Try elyxyb and reyvow  acutely  Try: Trudhesa, Memantine, candesartan, vyepti, cymbalta, depakote   Addendum 08/27/2018: Patient continues to suffer with uncharacteristic severe headaches and migraines which have been ongoing for months.  Despite adequate treatment,  nerve blocks, increasing atenolol, migraine infusions, CGRP she continues to worsen she is having chronic daily headaches now, her vision is worsening becoming more blurry with diplopia, the headaches are becoming progressively more positional and exertional and occipital.  Patient needs an MRI of the brain with and without contrast as soon as possible.  Interval history 07/24/2018: Patient is being seen today in the office due to intractable severe headache and status migrainosus.  This is a very nice 67 year old patient with years of migraines (see below for history) and she is failed a tremendous amount of medications most first and second line migraine preventatives as well as many acute management medications such as triptans (for a full list see below).  She appears very uncomfortable today, she says her migraine is 10 out of 10.  Today we discussed multiple options, first was ways to break her migraine.  We also discussed acute management of her migraines in the future, and preventative medication.  Botox is the most appropriate way to proceed.  She has failed to CGRP medication and is currently not on them.  For more details on her migraines see history of present illness below.  Virtual Visit via Video Note 07/20/2018  I connected with@ on 05/31/22 at  4:00 PM EST by a video enabled telemedicine application and verified that I am speaking with the correct person using two identifiers. Patient is at her work office and physician is in the office as well.    I discussed the limitations of evaluation and management by telemedicine and the availability of in person appointments. The patient expressed understanding and agreed to proceed.  Anson Fret, MD  HPI:  Michele [name] is a 67 y.o. female here as requested by Rodrigo Ran, MD for migraines. Patient has had migraines for years and has tried many medications. Currently, she has had a 9-week migraine that waxes and wanes but is daily. She has  missed work seeing patients 3 times She has been in status migrainosus in the past but  this is the first time she has had a migraine this long and intractable. The last time she had imaging was in 2018 and normal. The migraine quality can be different, she does not have auras and she has tried all the triptans. Migraines are unilateral through the eye, pulsating and pounding, photo/phonophobia, movement makes it worse. Light is a trigger. Other times they can be all over the head. +nausea often, when she does get nausea she is incapacitated 3x in the last 9 weeks. Migraines are severe or moderately severe.  Atenolol helps.  She has 18 headache days a month on average and 10 migraine days a month for longer than one year. Over the last 9 weeks daily migraines. She tried aimovig for 3 months last fall. She tried Emgality 2 doses and then the headache started for 9 weeks. She is not on CGRP at this time. She tried baclofen and compazine but could not tolerate it. Compazine made her stomach feel worse. Toradol makes her sick. She is not tolerant of any of the triptans. No medication overuse. Migraines can last a minimum of 24 hours.  No other focal neurologic deficits, associated symptoms, inciting events or modifiable factors.   Meds tried (this is not a complete list) : Zofran,  tizanidine, atenolol, celexa, topiramate, propranolol, lopressor, phenergan, flexeril, lamictal, cymbalta, effexor, prozac, viibryd  Reviewed notes, labs and imaging from outside physicians, which showed:  She also takes Zofran 4mg  for nausea Tizanidine 2mg  for aches and pains and is helpful Atenolol 50mg  Celexa 40mg  QD  Current and past medications ANALGESICS:demerol,tylenol,ultram,vicodin ANTI-MIGRAINE: axert, cafergot, DHE-45, imitrex tablet/injection, maxalt, midrin. She tells me she does not tolerate triptans well.  HEART/BP: lopressor, atenolol (very helpful) DECONGESTANT/ANTIHISTAMINE: allegra, clarinex,  zyrtec ANTI-NAUSEANT: phenergan, vistaril, zofran NSAIDS: ibuprofen, naproxen, mobic, toradol MUSCLE RELAXANTS: amrix, flexeril, tizanidine ANTI-CONVULSANTS: topamax (expressive aphasia), Klonopin, Lamictal STEROIDS: medrol SLEEPING PILLS/TRANQUILIZERS:ambien, melatonin, xanax  ANTI-DEPRESSANTS: celexa, cymbalta, effexor (increased anxiety) pristiq, prozac, viibryd HERBAL: magnesium citrate (GI upset) FIBROMYALGIA:  HORMONAL: OTHER:  PROCEDURES FOR HEADACHES:   Review of Systems: Patient complains of symptoms per HPI as well as the following symptoms: headaches . Pertinent negatives and positives per HPI. All others negative    Social History   Socioeconomic History   Marital status: Married    Spouse name: Not on file   Number of children: 2   Years of education: Not on file   Highest education level: Master's degree (e.g., MA, MS, MEng, MEd, MSW, MBA)  Occupational History   Occupation: NP  Tobacco Use   Smoking status: Never   Smokeless tobacco: Never  Vaping Use   Vaping Use: Never used  Substance and Sexual Activity   Alcohol use: Never   Drug use: Never   Sexual activity: Not on file  Other Topics Concern   Not on file  Social History Narrative   Lives at home with spouse   Right handed   Caffeine: 2 cups daily    Social Determinants of Health   Financial Resource Strain: Not on file  Food Insecurity: Not on file  Transportation Needs: Not on file  Physical Activity: Not on file  Stress: Not on file  Social Connections: Not on file  Intimate Partner Violence: Not on file    Family History  Problem Relation Age of Onset   Lung cancer Father    Headache Father    Other Sister        ureter tumor, smoking related   Allergic Disorder Brother    Anxiety disorder Brother    Colon cancer Neg Hx    Colon polyps Neg Hx    Esophageal cancer Neg Hx    Rectal cancer Neg Hx     Past Medical History:  Diagnosis Date   Allergy    seasonal   Anxiety     Chronic headaches    Dyspareunia in female    GERD (gastroesophageal reflux disease)    prn   Greater trochanteric bursitis    bilateral   Hypersomnia    Migraines    Vitamin D deficiency    in the past per pt    Patient Active Problem List   Diagnosis Date Noted   Status migrainosus 06/01/2021   Chronic migraine without aura, with intractable migraine, so stated, with status migrainosus 04/12/2021   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 Take the first dose (6 pills) as soon as possible. Take the rest each morning. For 6 days total 6-5-4-3-2-1. 21 tablet 1   Rimegepant Sulfate (NURTEC) 75 MG TBDP Take 1 tablet (75 mg total) by mouth daily as needed. For migraines. Take as close to onset of migraine as possible. One daily maximum. 16 tablet 11   Armodafinil 250 MG tablet Take 125 mg by mouth 2 (two) times daily.     atenolol (TENORMIN) 50 MG tablet Take 1.5 tablets (75 mg total) by mouth daily. 135 tablet 4   clonazePAM (KLONOPIN) 1 MG tablet Take 1 tablet (1mg ) three times a day as needed 90 tablet 4   HYDROcodone-acetaminophen (NORCO) 10-325 MG tablet Take 1 tablet by mouth every 4 (four) hours as needed for severe pain. 42 tablet 0   lamoTRIgine (LAMICTAL) 100 MG tablet Take 2 tablets (200 mg total) by mouth 2 (two) times daily. 180 tablet 11   Lasmiditan Succinate (REYVOW) 100 MG TABS  Take 1 tablet (100 mg total) by mouth daily as needed. Do not drive for 8 hours, may cause sedation 8 tablet 11   ondansetron (ZOFRAN-ODT) 4 MG disintegrating tablet Take 1 tablet (4 mg total) by mouth every 8 (eight) hours as needed for nausea. 60 tablet 11   sodium chloride (OCEAN) 0.65 % nasal spray 1-2 sprays to each nare every hour as needed.  (Compound with Lidocaine 4%). 30 mL 0   VIVELLE-DOT 0.075 MG/24HR Place 1 patch onto the skin 2 (two) times a week. 8 patch 12   Current Facility-Administered Medications  Medication Dose Route Frequency Provider Last Rate Last Admin   bupivacaine(PF) (MARCAINE) 0.5 % injection 36 mL  36 mL Other Once Anson Fret, MD        Allergies as of 05/27/2022   (No Known Allergies)    Vitals: There were no vitals taken for this visit. Last Weight:  Wt Readings from Last 1 Encounters:  04/01/21 125 lb (56.7 kg)   Last Height:   Ht Readings from Last 1 Encounters:  04/01/21 5\' 6"  (1.676 m)  Exam: NAD, pleasant                  Speech:    Speech is normal; fluent and spontaneous with normal comprehension.  Cognition:    The patient is oriented to person, place, and time;     recent and remote memory intact;     language fluent;    Cranial Nerves:    The pupils are equal, round, and reactive to light.Trigeminal sensation is intact and the muscles of mastication are normal. The face is symmetric. The palate elevates in the midline. Hearing intact. Voice is normal. Shoulder shrug is normal. The tongue has normal motion without fasciculations.   Coordination:  No dysmetria  Motor Observation:    No asymmetry, no atrophy, and no involuntary movements noted. Tone:    Normal muscle tone.     Strength:    Strength is V/V in the upper and lower limbs.      Sensation: intact to LT     Assessment/Plan:  67 year old patient with intractable chronic migraines without aura. FAILED CGRP(Aimovig, Emgality, AAjovy), Botox and almost every  medication for migraines that I can think of. Doing GREAT on vyepti.   -  Continue on current regimen of atenolol, Celexa, vyepti, Lamictal, Zofran, Nurtec, for severe migraines I do give her hydrocodone that she uses rarely, she is on an estradiol patch and pays for brand we were unable to get that approved for the prior Auth.  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 Generic failed(tried increasing generic dose as well) and does not tolerate generic with side effects, needs BRAND only.  Again we have discussed increased risks of stroke.  - try for reyvow and lasmiditan. On Vepti on 4 migraines a month and < 10 total headache days a month. Can increase lamictal.   Meds ordered this encounter  Medications   Rimegepant Sulfate (NURTEC) 75 MG TBDP    Sig: Take 1 tablet (75 mg total) by mouth daily as needed. For migraines. Take as close to onset of migraine as possible. One daily maximum.    Dispense:  16 tablet    Refill:  11   Lasmiditan Succinate (REYVOW) 100 MG TABS    Sig: Take 1 tablet (100 mg total) by mouth daily as needed. Do not drive for 8 hours, may cause sedation    Dispense:  8 tablet    Refill:  11   atenolol (TENORMIN) 50 MG tablet    Sig: Take 1.5 tablets (75 mg total) by mouth daily.    Dispense:  135 tablet    Refill:  4   ondansetron (ZOFRAN-ODT) 4 MG disintegrating tablet    Sig: Take 1 tablet (4 mg total) by mouth every 8 (eight) hours as needed for nausea.    Dispense:  60 tablet    Refill:  11   lamoTRIgine (LAMICTAL) 100 MG tablet    Sig: Take 2 tablets (200 mg total) by mouth 2 (two) times daily.    Dispense:  180 tablet    Refill:  11   methylPREDNISolone (MEDROL DOSEPAK) 4 MG TBPK tablet    Sig: Take pills daily all together with food. Take the first dose (6 pills) as soon as possible. Take the rest each morning. For 6 days total 6-5-4-3-2-1.    Dispense:  21 tablet    Refill:  1      Naomie Dean, MD  [facility] [address] [place], [place] [address]  Phone [phone] Fax [phone]  I spent over 30 minutes of face-to-face and non-face-to-face time with patient on the  1. Chronic migraine without aura, intractable, with status migrainosus   2. Migraine with aura and without status migrainosus, not intractable   3. Tongue lesion   4. Migraine without aura and with status migrainosus, not intractable   5. Migraine with aura and with status migrainosus, not intractable      diagnosis.  This included previsit chart review, lab review, study review, order entry, electronic health record documentation, patient education on the different diagnostic and therapeutic options, counseling and coordination of care, risks and benefits of management, compliance, or risk factor reduction

## 2022-05-31 ENCOUNTER — Telehealth: Payer: Self-pay | Admitting: Neurology

## 2022-05-31 ENCOUNTER — Telehealth: Payer: Self-pay | Admitting: *Deleted

## 2022-05-31 ENCOUNTER — Encounter: Payer: Self-pay | Admitting: Neurology

## 2022-05-31 NOTE — Telephone Encounter (Signed)
LVM asking pt which ENT she would like to be sent to. If she calls back, please ask her where she prefers referral to go.

## 2022-05-31 NOTE — Telephone Encounter (Signed)
Dr. Benjamine Mola is no longer seeing patients with throat issues, ears and nose only. Do you have another provider you want this urgent referral sent to?

## 2022-05-31 NOTE — Telephone Encounter (Signed)
Call patient and let her know and see if she has anyone she prefers, that stinks!

## 2022-06-08 NOTE — Telephone Encounter (Signed)
Patient Advocate Encounter   Received notification that prior authorization for Reyvow 100MG tablets is required.   PA submitted on 06/08/2022 Key [id number] Status is pending       Lyndel Safe, CPhT Pharmacy Patient Advocate Specialist [facility] Patient Advocate Team Direct Number: [phone]  Fax: [phone]

## 2022-06-14 ENCOUNTER — Other Ambulatory Visit: Payer: Self-pay | Admitting: Internal Medicine

## 2022-06-14 DIAGNOSIS — E785 Hyperlipidemia, unspecified: Secondary | ICD-10-CM

## 2022-09-07 ENCOUNTER — Telehealth: Payer: Self-pay | Admitting: Pharmacy Technician

## 2022-09-07 DIAGNOSIS — G43109 Migraine with aura, not intractable, without status migrainosus: Secondary | ICD-10-CM

## 2022-09-07 NOTE — Telephone Encounter (Signed)
Patient Advocate Encounter  Prior Authorization for Nurtec 75MG  dispersible tablets has been approved.    PA# [id number] Key: [id number] Insurance [employer] Electronic PA Form Effective dates: 09/07/2022 through 09/07/2023

## 2022-09-30 ENCOUNTER — Ambulatory Visit (INDEPENDENT_AMBULATORY_CARE_PROVIDER_SITE_OTHER): Payer: Medicare Other | Admitting: Neurology

## 2022-09-30 DIAGNOSIS — G43711 Chronic migraine without aura, intractable, with status migrainosus: Secondary | ICD-10-CM | POA: Diagnosis not present

## 2022-09-30 DIAGNOSIS — G43109 Migraine with aura, not intractable, without status migrainosus: Secondary | ICD-10-CM

## 2022-09-30 DIAGNOSIS — R208 Other disturbances of skin sensation: Secondary | ICD-10-CM | POA: Diagnosis not present

## 2022-09-30 DIAGNOSIS — M542 Cervicalgia: Secondary | ICD-10-CM

## 2022-09-30 MED ORDER — METHYLPREDNISOLONE ACETATE 80 MG/ML IJ SUSP: 80.0000 mg | Freq: Once | INTRAMUSCULAR | Status: DC

## 2022-09-30 MED ORDER — LIDOCAINE HCL 2 % IJ SOLN: 3.0000 mL | Freq: Once | INTRAMUSCULAR | Status: DC

## 2022-09-30 MED ORDER — LIDOCAINE 5 % EX PTCH: 1.0000 | MEDICATED_PATCH | CUTANEOUS | 3 refills | Status: DC

## 2022-09-30 MED ORDER — BUPIVACAINE HCL 0.5 % IJ SOLN: 4.0000 mL | Freq: Once | INTRAMUSCULAR | Status: DC

## 2022-09-30 MED ORDER — BUPIVACAINE HCL 0.5 % IJ SOLN: 15.0000 mL | Freq: Once | INTRAMUSCULAR | Status: DC

## 2022-09-30 MED ORDER — LIDOCAINE HCL 2 % IJ SOLN: 2.0000 mL | Freq: Once | INTRAMUSCULAR | Status: DC

## 2022-09-30 NOTE — Progress Notes (Signed)
Nerve block  without steroid: Pt signed consent yes   0.5% Bupivocaine 19 mL LOT: [id number] EXP: 11/2024 NDC: 44010-272-53  2% Lidocaine  5 mL LOT: [id number] EXP: 10/2024 NDC: 42595-638-75  Witnessed by Elana Alm

## 2022-10-01 ENCOUNTER — Telehealth: Payer: Self-pay

## 2022-10-01 ENCOUNTER — Other Ambulatory Visit (HOSPITAL_COMMUNITY): Payer: Self-pay

## 2022-10-01 NOTE — Telephone Encounter (Signed)
Pharmacy Patient Advocate Encounter   Received notification from [employer] that prior authorization for Lidocaine 5% patches is required/requested.   PA submitted to [facility] via CoverMyMeds Key or (Medicaid) confirmation # [id number] Status is pending

## 2022-10-01 NOTE — Telephone Encounter (Signed)
Pharmacy Patient Advocate Encounter  Received notification from [facility] that the request for prior authorization for Lidocaine Patch has been denied due to see below.     Please be advised we currently do not have a Pharmacist to review denials, therefore you will need to process appeals accordingly as needed. Thanks for your support at this time.    The denial letter has been placed in the chart under the media tab.

## 2022-10-05 NOTE — Telephone Encounter (Signed)
I called pt and relayed that her insurance denied.  She is doing ok right now.  Using 3% OTC.  She understands about insurance.  Appreciated call back.

## 2022-10-06 NOTE — Progress Notes (Addendum)
  01/13/2023: Patient reports that Vyepti 100mg  was initially very helpful but oit has become inefective and she still has a burden of migraines monthly of 7 migraine days and 10 total headache days a month and worsening that is affecting her quality of life we will increase vyepti to 300mg  q45months for better efficacy. If we increase Vyepti this will further improve her migraine disorder. She has had >=3 infusions at 100mg .   Acute neck pain left. Decreased range of motion. Hurting for multiple weeks. Cannot more head left laterally.   .Performed by Dr. Barry Dienes.D.30-gauge needle was used. All procedures a documented blood were medically necessary, reasonable and appropriate based on the patient's history, medical diagnosis and physician opinion. Verbal informed consent was obtained from the patient, patient was informed of potential risk of procedure, including bruising, bleeding, hematoma formation, infection, muscle weakness, muscle pain, numbness, transient hypertension, transient hyperglycemia and transient insomnia among others. All areas injected were topically clean with isopropyl rubbing alcohol. Nonsterile nonlatex gloves were worn during the procedure.  Injected levator scapulae, semispinalis capitus, trapezius, splenius capitus   Gave her heat pack and then placed salonpas. Tried to get lidoderm patches, not approved, she can buy a slightly lower dose otc.  Meds ordered this encounter  Medications   lidocaine (XYLOCAINE) 2 % (with pres) injection 60 mg   bupivacaine (MARCAINE) 0.5 % (with pres) injection 15 mL   lidocaine (XYLOCAINE) 2 % (with pres) injection 40 mg   bupivacaine (MARCAINE) 0.5 % (with pres) injection 4 mL   methylPREDNISolone acetate (DEPO-MEDROL) injection 80 mg   lidocaine (LIDODERM) 5 %    Sig: Place 1 patch onto the skin daily. Remove & Discard patch within 12 hours or as directed by MD    Dispense:  30 patch    Refill:  3   I spent over 30 minutes of  face-to-face and non-face-to-face time with patient on the  1. Acute neck pain   2. Chronic migraine without aura, intractable, with status migrainosus   3. Migraine with aura and without status migrainosus, not intractable   4. Allodynia     diagnosis.  This included previsit chart review, lab review, study review, order entry, electronic health record documentation, patient education on the different diagnostic and therapeutic options, counseling and coordination of care, risks and benefits of management, compliance, or risk factor reduction

## 2022-10-18 ENCOUNTER — Other Ambulatory Visit: Payer: Self-pay | Admitting: Neurology

## 2022-10-18 MED ORDER — BUSPIRONE HCL 5 MG PO TABS: 5.0000 mg | ORAL_TABLET | Freq: Three times a day (TID) | ORAL | 4 refills | Status: DC

## 2022-10-18 MED ORDER — HYDROCODONE-ACETAMINOPHEN 10-325 MG PO TABS: 1.0000 | ORAL_TABLET | ORAL | 0 refills | Status: DC | PRN

## 2022-10-18 MED ORDER — ONDANSETRON 8 MG PO TBDP: 8.0000 mg | ORAL_TABLET | Freq: Three times a day (TID) | ORAL | 11 refills | Status: DC | PRN

## 2023-01-13 MED ORDER — NURTEC 75 MG PO TBDP: 75.0000 mg | ORAL_TABLET | Freq: Every day | ORAL | 11 refills | Status: DC | PRN

## 2023-01-13 NOTE — Addendum Note (Signed)
Addended by: Naomie Dean B on: 01/13/2023 02:56 PM   Modules accepted: Orders

## 2023-01-18 ENCOUNTER — Other Ambulatory Visit (HOSPITAL_COMMUNITY): Payer: Self-pay

## 2023-01-18 ENCOUNTER — Telehealth: Payer: Self-pay | Admitting: *Deleted

## 2023-01-18 NOTE — Telephone Encounter (Signed)
Order for Vyepti 300 mg IV every 3 months given along with last visit  to [facility] w/ [facility].

## 2023-01-19 MED ORDER — NURTEC 75 MG PO TBDP
75.0000 mg | ORAL_TABLET | Freq: Every day | ORAL | 11 refills | Status: AC | PRN
Start: 2023-01-19 — End: ?

## 2023-01-19 NOTE — Telephone Encounter (Signed)
Per Dr Lucia Gaskins, I spoke with the patient regarding her Nurtec. Patient is aware our PA dept ran a test claim. PA is on file through 09/07/23 and prescription is at [facility]. Pt would like the prescription sent to [facility] pharmacy instead and have all other pharmacies removed from list. Pharmacy list updated. Pt verbalized appreciation for the call.   Nurtec prescription sent over to [facility].

## 2023-01-19 NOTE — Addendum Note (Signed)
Addended by: Bertram Savin on: 01/19/2023 10:01 AM   Modules accepted: Orders

## 2023-01-20 ENCOUNTER — Other Ambulatory Visit (HOSPITAL_COMMUNITY): Payer: Self-pay

## 2023-01-25 ENCOUNTER — Ambulatory Visit: Payer: Medicare Other | Admitting: Neurology

## 2023-01-26 ENCOUNTER — Ambulatory Visit (INDEPENDENT_AMBULATORY_CARE_PROVIDER_SITE_OTHER): Payer: Medicare Other | Admitting: Neurology

## 2023-01-26 DIAGNOSIS — G43711 Chronic migraine without aura, intractable, with status migrainosus: Secondary | ICD-10-CM

## 2023-01-26 DIAGNOSIS — K1379 Other lesions of oral mucosa: Secondary | ICD-10-CM | POA: Diagnosis not present

## 2023-01-26 DIAGNOSIS — K146 Glossodynia: Secondary | ICD-10-CM

## 2023-01-26 MED ORDER — LIDOCAINE VISCOUS HCL 2 % MT SOLN: 15.0000 mL | OROMUCOSAL | 11 refills | Status: DC | PRN

## 2023-01-26 MED ORDER — ONABOTULINUMTOXINA 200 UNITS IJ SOLR
155.0000 [IU] | Freq: Once | INTRAMUSCULAR | Status: DC
Start: 2023-01-26 — End: 2023-11-22

## 2023-01-26 NOTE — Progress Notes (Signed)
Botox- 200 units x 1 vial Lot: [id number] Expiration: 09/2024 NDC: 4782-9562-13  Bacteriostatic 0.9% Sodium Chloride- 4 mL  Lot: [id number] Expiration: 07/19/2023 NDC: 4696-2952-84  Dx: G43.71 Samples Witnessed by Alveria Apley. CMA

## 2023-01-26 NOTE — Patient Instructions (Addendum)
For burning mouth Benfotiamine 300mg  twice daily for 30 days then can decrease to 150mg  twice daily 1mg  folic acid daily Alpha lipoic acid 600mg  twice daily Klonopin up to 3x a day  Lidocaine gargle 1-2x a day 4%  Vitamin b6 100 in the morning Vitamin b12 1049mcg-2000 daily

## 2023-01-29 DIAGNOSIS — G43711 Chronic migraine without aura, intractable, with status migrainosus: Secondary | ICD-10-CM | POA: Insufficient documentation

## 2023-01-29 NOTE — Progress Notes (Addendum)
 78/2025: Patient receives >> 50% improvement in migraine frequency and severity on Vyepti 300mg , continue  06/17/2023: addendum, appreciate our cardiology colleagues tremendously! 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  Only second degree type 2 or third degree are signfiicant.  Changing her atenolol  would not address the first degree AV block  05/26/2023: Addendum, patient with shortness of breath, chest pain and chest pressure ongoing and worsening. Discussed with her pcp Dr. Shayne, ordering heart monitor, echocardiogram and CT Calcium scoring. If needed will then order cardiac referral.    Consent Form Botulism Toxin Injection For Chronic Migraine  PATIENT WITH GREAT IMPROVEMENT OF 50% ON VYEPTI BUT STILL HAS A BURDEN OF 8 MIGRAINE DAYS A MONTH AND 15 HEADACHE DAYS A MONTH SO WILL ASK FOR BOTOX  APPROVAL  Burning mouth syndrome:  For burning mouth Benfotiamine 300mg  twice daily for 30 days then can decrease to 150mg  twice daily 1mg  folic acid daily Alpha lipoic acid 600mg  twice daily Klonopin  up to 3x a day  Lidocaine  gargle 1-2x a day 4%  Vitamin b6 100 in the morning Vitamin b12 1000mcg-2000 daily   Reviewed orally with patient, additionally signature is on file:  Botulism toxin has been approved by the Federal drug administration for treatment of chronic migraine. Botulism toxin does not cure chronic migraine and it may not be effective in some patients.  The administration of botulism toxin is accomplished by injecting a small amount of toxin into the muscles of the neck and head. Dosage must be titrated for each individual. Any benefits resulting from botulism toxin tend to wear off after 3 months with a repeat injection required if benefit is to be maintained. Injections are usually done every 3-4 months with maximum effect peak achieved by about 2 or 3 weeks. Botulism toxin is expensive and you should be sure of what  costs you will incur resulting from the injection.  The side effects of botulism toxin use for chronic migraine may include:   -Transient, and usually mild, facial weakness with facial injections  -Transient, and usually mild, head or neck weakness with head/neck injections  -Reduction or loss of forehead facial animation due to forehead muscle weakness  -Eyelid drooping  -Dry eye  -Pain at the site of injection or bruising at the site of injection  -Double vision  -Potential unknown long term risks  Contraindications: You should not have Botox  if you are pregnant, nursing, allergic to albumin, have an infection, skin condition, or muscle weakness at the site of the injection, or have myasthenia gravis, Lambert-Eaton syndrome, or ALS.  It is also possible that as with any injection, there may be an allergic reaction or no effect from the medication. Reduced effectiveness after repeated injections is sometimes seen and rarely infection at the injection site may occur. All care will be taken to prevent these side effects. If therapy is given over a long time, atrophy and wasting in the muscle injected may occur. Occasionally the patient's become refractory to treatment because they develop antibodies to the toxin. In this event, therapy needs to be modified.  I have read the above information and consent to the administration of botulism toxin.    BOTOX  PROCEDURE NOTE FOR MIGRAINE HEADACHE    Contraindications and precautions discussed with patient(above). Aseptic procedure was observed and patient tolerated procedure. Procedure performed by Dr. Andree Epp  The condition has existed for more than 6 months, and pt does not have a  diagnosis of ALS, Myasthenia Gravis or Lambert-Eaton Syndrome.  Risks and benefits of injections discussed and pt agrees to proceed with the procedure.  Written consent obtained  These injections are medically necessary. Pt  receives good benefits from these  injections. These injections do not cause sedations or hallucinations which the oral therapies may cause.  Description of procedure:  The patient was placed in a sitting position. The standard protocol was used for Botox  as follows, with 5 units of Botox  injected at each site:   -Procerus muscle, midline injection  -Corrugator muscle, bilateral injection  -Frontalis muscle, bilateral injection, with 2 sites each side, medial injection was performed in the upper one third of the frontalis muscle, in the region vertical from the medial inferior edge of the superior orbital rim. The lateral injection was again in the upper one third of the forehead vertically above the lateral limbus of the cornea, 1.5 cm lateral to the medial injection site.  -Temporalis muscle injection, 4 sites, bilaterally. The first injection was 3 cm above the tragus of the ear, second injection site was 1.5 cm to 3 cm up from the first injection site in line with the tragus of the ear. The third injection site was 1.5-3 cm forward between the first 2 injection sites. The fourth injection site was 1.5 cm posterior to the second injection site.   -Occipitalis muscle injection, 3 sites, bilaterally. The first injection was done one half way between the occipital protuberance and the tip of the mastoid process behind the ear. The second injection site was done lateral and superior to the first, 1 fingerbreadth from the first injection. The third injection site was 1 fingerbreadth superiorly and medially from the first injection site.  -Cervical paraspinal muscle injection, 2 sites, bilateral knee first injection site was 1 cm from the midline of the cervical spine, 3 cm inferior to the lower border of the occipital protuberance. The second injection site was 1.5 cm superiorly and laterally to the first injection site.  -Trapezius muscle injection was performed at 3 sites, bilaterally. The first injection site was in the upper  trapezius muscle halfway between the inflection point of the neck, and the acromion. The second injection site was one half way between the acromion and the first injection site. The third injection was done between the first injection site and the inflection point of the neck.   Will return for repeat injection in 3 months.   200 units of Botox  was used, any Botox  not injected was wasted. The patient tolerated the procedure well, there were no complications of the above procedure. Used samples.  I spent 40 minutes of face-to-face and non-face-to-face time with patient on the  1. Chronic migraine without aura, intractable, with status migrainosus   2. Burning mouth syndrome   3. Mouth pain    diagnosis.  This included previsit chart review, lab review, study review, order entry, electronic health record documentation, patient education on the different diagnostic and therapeutic options, counseling and coordination of care, risks and benefits of management, compliance, or risk factor reduction

## 2023-01-31 ENCOUNTER — Telehealth: Payer: Self-pay | Admitting: *Deleted

## 2023-01-31 NOTE — Telephone Encounter (Signed)
Chronic Migraine CPT 64615    Botox J0585 Units:155  G43.711 Chronic Migraine without aura, intractable, with status migrainous

## 2023-01-31 NOTE — Telephone Encounter (Signed)
Submitted benefit verification, will update once results are received. [id number]

## 2023-01-31 NOTE — Telephone Encounter (Signed)
Per Jeanice Lim w/ [facility], patient has been approved for the 300 mg.

## 2023-02-01 NOTE — Telephone Encounter (Signed)
Yes she was samples so she can come back anytime she like you can add her on at end of day since she is provider like 430 with me thank you!

## 2023-02-01 NOTE — Telephone Encounter (Signed)
LVM asking pt to call back and schedule next Botox appt.

## 2023-02-01 NOTE — Telephone Encounter (Signed)
Pt's insurance does not require auth, she will be buy/bill. I wanted to clarify, did she receive samples at 10/9 visit? I'm trying to see when I need to schedule her next appt for.

## 2023-02-02 NOTE — Telephone Encounter (Signed)
Called pt again to schedule appt, [place]. She states she will schedule Botox appt when she comes in for her next infusion.

## 2023-02-10 ENCOUNTER — Other Ambulatory Visit: Payer: Self-pay | Admitting: Neurology

## 2023-02-10 ENCOUNTER — Telehealth: Payer: Self-pay | Admitting: Neurology

## 2023-02-10 ENCOUNTER — Other Ambulatory Visit: Payer: Self-pay | Admitting: *Deleted

## 2023-02-10 DIAGNOSIS — R109 Unspecified abdominal pain: Secondary | ICD-10-CM

## 2023-02-10 MED ORDER — LEVOFLOXACIN 500 MG PO TABS: 500.0000 mg | ORAL_TABLET | Freq: Every day | ORAL | 0 refills | Status: DC

## 2023-02-10 NOTE — Telephone Encounter (Signed)
Referral for gastroenterology fax to [facility] Phone: [phone], Fax: [phone]

## 2023-02-14 NOTE — Telephone Encounter (Signed)
Spoke with [facility] on [address]. I confirmed they did receive the referral and patient will be contacted. This is the location that Shirleen Schirmer PA works at full time.

## 2023-02-15 ENCOUNTER — Other Ambulatory Visit: Payer: Self-pay | Admitting: Neurology

## 2023-02-15 DIAGNOSIS — G43101 Migraine with aura, not intractable, with status migrainosus: Secondary | ICD-10-CM

## 2023-02-15 DIAGNOSIS — G43001 Migraine without aura, not intractable, with status migrainosus: Secondary | ICD-10-CM

## 2023-02-15 MED ORDER — LAMOTRIGINE ER 200 MG PO TB24: 200.0000 mg | ORAL_TABLET | Freq: Every day | ORAL | 4 refills | Status: DC

## 2023-02-22 ENCOUNTER — Ambulatory Visit (HOSPITAL_BASED_OUTPATIENT_CLINIC_OR_DEPARTMENT_OTHER)
Admission: RE | Admit: 2023-02-22 | Discharge: 2023-02-22 | Disposition: A | Discharge status: Home / Self Care | Payer: Medicare Other | Source: Ambulatory Visit | Attending: Internal Medicine | Admitting: Internal Medicine

## 2023-02-22 ENCOUNTER — Other Ambulatory Visit (HOSPITAL_COMMUNITY): Payer: Self-pay | Admitting: Internal Medicine

## 2023-02-22 DIAGNOSIS — A09 Infectious gastroenteritis and colitis, unspecified: Secondary | ICD-10-CM

## 2023-02-22 DIAGNOSIS — K8020 Calculus of gallbladder without cholecystitis without obstruction: Secondary | ICD-10-CM | POA: Diagnosis not present

## 2023-02-22 LAB — POCT I-STAT CREATININE: Creatinine, Ser: 1.1 mg/dL — ABNORMAL HIGH (ref 0.44–1.00)

## 2023-02-22 MED ORDER — IOHEXOL 300 MG/ML  SOLN
100.0000 mL | Freq: Once | INTRAMUSCULAR | Status: AC | PRN
  Administered 2023-02-22: 80 mL via INTRAVENOUS

## 2023-04-06 ENCOUNTER — Other Ambulatory Visit: Payer: Self-pay | Admitting: Neurology

## 2023-04-06 MED ORDER — HYDROCODONE-ACETAMINOPHEN 10-325 MG PO TABS: 1.0000 | ORAL_TABLET | ORAL | 0 refills | Status: DC | PRN

## 2023-05-15 ENCOUNTER — Other Ambulatory Visit: Payer: Self-pay | Admitting: Neurology

## 2023-05-15 MED ORDER — SERTRALINE HCL 100 MG PO TABS: 150.0000 mg | ORAL_TABLET | Freq: Every day | ORAL | 4 refills | Status: DC

## 2023-05-19 ENCOUNTER — Other Ambulatory Visit: Payer: Self-pay | Admitting: Neurology

## 2023-05-19 DIAGNOSIS — K146 Glossodynia: Secondary | ICD-10-CM

## 2023-05-19 DIAGNOSIS — K1379 Other lesions of oral mucosa: Secondary | ICD-10-CM

## 2023-05-19 MED ORDER — LIDOCAINE VISCOUS HCL 2 % MT SOLN: 15.0000 mL | OROMUCOSAL | 11 refills | Status: DC | PRN

## 2023-05-19 MED ORDER — CLONAZEPAM 0.5 MG PO TBDP: ORAL_TABLET | ORAL | 0 refills | Status: DC

## 2023-05-26 ENCOUNTER — Other Ambulatory Visit: Payer: Self-pay | Admitting: Neurology

## 2023-05-26 DIAGNOSIS — R079 Chest pain, unspecified: Secondary | ICD-10-CM

## 2023-05-26 DIAGNOSIS — R0602 Shortness of breath: Secondary | ICD-10-CM

## 2023-05-26 DIAGNOSIS — R0789 Other chest pain: Secondary | ICD-10-CM

## 2023-05-26 DIAGNOSIS — K146 Glossodynia: Secondary | ICD-10-CM

## 2023-05-26 MED ORDER — CLONAZEPAM 0.5 MG PO TBDP: ORAL_TABLET | ORAL | 4 refills | Status: DC

## 2023-05-27 ENCOUNTER — Other Ambulatory Visit: Payer: Self-pay | Admitting: Neurology

## 2023-05-27 ENCOUNTER — Encounter: Payer: Self-pay | Admitting: Radiology

## 2023-05-27 DIAGNOSIS — R0602 Shortness of breath: Secondary | ICD-10-CM

## 2023-05-27 DIAGNOSIS — R079 Chest pain, unspecified: Secondary | ICD-10-CM

## 2023-05-27 DIAGNOSIS — R0789 Other chest pain: Secondary | ICD-10-CM

## 2023-05-27 DIAGNOSIS — K146 Glossodynia: Secondary | ICD-10-CM

## 2023-05-27 DIAGNOSIS — R002 Palpitations: Secondary | ICD-10-CM

## 2023-06-04 DIAGNOSIS — R002 Palpitations: Secondary | ICD-10-CM | POA: Diagnosis not present

## 2023-06-04 DIAGNOSIS — R0602 Shortness of breath: Secondary | ICD-10-CM | POA: Diagnosis not present

## 2023-06-04 DIAGNOSIS — R079 Chest pain, unspecified: Secondary | ICD-10-CM | POA: Diagnosis not present

## 2023-06-04 DIAGNOSIS — R0789 Other chest pain: Secondary | ICD-10-CM | POA: Diagnosis not present

## 2023-06-06 ENCOUNTER — Other Ambulatory Visit: Payer: Self-pay | Admitting: Neurology

## 2023-06-06 DIAGNOSIS — J069 Acute upper respiratory infection, unspecified: Secondary | ICD-10-CM

## 2023-06-06 MED ORDER — AZITHROMYCIN 250 MG PO TABS: ORAL_TABLET | ORAL | 0 refills | Status: DC

## 2023-06-09 ENCOUNTER — Ambulatory Visit: Payer: Medicare Other | Attending: Neurology

## 2023-06-09 DIAGNOSIS — R0602 Shortness of breath: Secondary | ICD-10-CM

## 2023-06-09 DIAGNOSIS — R0789 Other chest pain: Secondary | ICD-10-CM

## 2023-06-09 DIAGNOSIS — R079 Chest pain, unspecified: Secondary | ICD-10-CM

## 2023-06-09 DIAGNOSIS — R002 Palpitations: Secondary | ICD-10-CM

## 2023-06-14 ENCOUNTER — Telehealth: Payer: Self-pay | Admitting: *Deleted

## 2023-06-14 NOTE — Telephone Encounter (Signed)
-----   Message from Anson Fret sent at 06/13/2023  3:46 PM EST ----- The 3 day monitor looked fine. You had one event that corresponded with first degree AV block. First-degree AV block is a benign condition and rarely progresses to a more serious heart block but I will forward this to Dr. Waynard Edwards should probably have an EKG yearly thanks

## 2023-06-14 NOTE — Telephone Encounter (Signed)
 Transferred call to nurse in POD4

## 2023-06-14 NOTE — Telephone Encounter (Signed)
 I called pt and LVM (Ok per [facility]) advising patient of her 3 day cardiac monitor results and recommendations as noted below by Dr Lucia Gaskins. I asked the pt to call us back if needed and let her know the results were sent to Dr Waynard Edwards for follow-up.

## 2023-06-14 NOTE — Telephone Encounter (Signed)
 Pt returned my call and I gave her the results over the phone. She asked if this 1st degree block could be related to her long term use of Atenolol for headache prevention.

## 2023-06-16 NOTE — Telephone Encounter (Signed)
 Spoke with the patient.  After we spoke the other day, she recalled that a previous nurse practitioner found first-degree heart block awhile back.  She believes it is linked to the atenolol and since she has been on the medication for so long and did not have any symptoms while she had the monitor on that showed the episode, she wants to wait and see what the echo shows, etc.  She states she may consider reducing her atenolol by 50%.  She is currently taking 50 mg daily.

## 2023-06-16 NOTE — Telephone Encounter (Signed)
 Yes the atenolol can make it worse. I would recommend a cardiology appointment or if she wants to discuss with Dr. Waynard Edwards first that is fine too. Happy to do whatever she wants thanks

## 2023-06-17 NOTE — Telephone Encounter (Signed)
 I spoke with her and I agree thanks

## 2023-06-20 NOTE — Telephone Encounter (Signed)
 Noted thanks

## 2023-06-27 ENCOUNTER — Other Ambulatory Visit: Payer: Self-pay | Admitting: Neurology

## 2023-06-27 MED ORDER — PROMETHAZINE HCL 25 MG PO TABS: 25.0000 mg | ORAL_TABLET | Freq: Four times a day (QID) | ORAL | 6 refills | Status: DC | PRN

## 2023-07-03 ENCOUNTER — Encounter: Payer: Self-pay | Admitting: Neurology

## 2023-07-14 ENCOUNTER — Ambulatory Visit (INDEPENDENT_AMBULATORY_CARE_PROVIDER_SITE_OTHER): Payer: BLUE CROSS/BLUE SHIELD

## 2023-07-14 ENCOUNTER — Ambulatory Visit (HOSPITAL_BASED_OUTPATIENT_CLINIC_OR_DEPARTMENT_OTHER)
Admission: RE | Admit: 2023-07-14 | Discharge: 2023-07-14 | Disposition: A | Discharge status: Home / Self Care | Payer: Self-pay | Source: Ambulatory Visit | Attending: Neurology | Admitting: Neurology

## 2023-07-14 DIAGNOSIS — R079 Chest pain, unspecified: Secondary | ICD-10-CM

## 2023-07-14 DIAGNOSIS — R0789 Other chest pain: Secondary | ICD-10-CM | POA: Diagnosis not present

## 2023-07-14 DIAGNOSIS — R0602 Shortness of breath: Secondary | ICD-10-CM

## 2023-07-14 LAB — ECHOCARDIOGRAM COMPLETE BUBBLE STUDY
Area-P 1/2: 5.02 cm2
MV M vel: 2.63 m/s
MV Peak grad: 27.7 mmHg
S' Lateral: 2.34 cm

## 2023-07-18 ENCOUNTER — Encounter: Payer: Self-pay | Admitting: Neurology

## 2023-07-24 ENCOUNTER — Encounter: Payer: Self-pay | Admitting: Neurology

## 2023-07-25 ENCOUNTER — Other Ambulatory Visit: Payer: Self-pay

## 2023-08-01 ENCOUNTER — Telehealth: Payer: Self-pay | Admitting: *Deleted

## 2023-08-01 NOTE — Telephone Encounter (Signed)
 Pt called office asking if we have active PA for Nurtec. She has medicare and her copay is $400. I informed patient that her PA w/ [employer] is on file through 09/07/23. She may have deductible she has to meet. She said she will call pharmacy to confirm they are processing through insurance. She thanked me for the call.

## 2023-08-19 ENCOUNTER — Other Ambulatory Visit (HOSPITAL_COMMUNITY): Payer: Self-pay

## 2023-08-19 ENCOUNTER — Telehealth: Payer: Self-pay

## 2023-08-19 NOTE — Telephone Encounter (Signed)
 Pharmacy Patient Advocate Encounter   Received notification from CoverMyMeds that prior authorization for lamoTRIgine  ER 200MG  er tablets is required/requested.   Insurance verification completed.   The patient is insured through [facility] .   Per test claim: PA required; PA submitted to above mentioned insurance via CoverMyMeds Key/confirmation #/EOC [id number] Status is pending

## 2023-08-22 ENCOUNTER — Encounter: Payer: Self-pay | Admitting: Neurology

## 2023-08-22 ENCOUNTER — Ambulatory Visit: Admitting: Neurology

## 2023-08-22 ENCOUNTER — Telehealth: Payer: Self-pay | Admitting: Neurology

## 2023-08-22 ENCOUNTER — Other Ambulatory Visit (HOSPITAL_COMMUNITY): Payer: Self-pay

## 2023-08-22 NOTE — Telephone Encounter (Signed)
 Patient said having a 4-day migraine, want to know if can get a nerve block later today. Would like a call back.

## 2023-08-22 NOTE — Telephone Encounter (Signed)
 Patient has infusion, elected to go home. She will be back at 730 am if needed in the morning.

## 2023-08-22 NOTE — Telephone Encounter (Signed)
 Spoke with pt and Dr Tresia Fruit. Will not do Benadryl. Patient said last time Toradol IM made h/a worse but she wants to try it at 15 mg instead. 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 Migraine x 4 days unresponsive to Nurtec, Zofran , Promethazine . Started Prednisone yesterday. Pt unable to complete work day today.   Signed orders submitted to infusion suite:  Depacon 1 gram IV x 1  Toradol 15 mg IV x 1  0.9% Normal Saline 250 mL IV bolus x 1

## 2023-08-22 NOTE — Telephone Encounter (Signed)
 Pharmacy Patient Advocate Encounter  Received notification from [facility] that Prior Authorization for lamoTRIgine  ER 200MG  er tablets has been APPROVED from 08/18/2023 to 08/18/2024. Unable to obtain price due to refill too soon rejection, last fill date 08/20/2023 next available fill date5/27/2025   PA #/Case ID/Reference #: PA Case ID #: [id number]

## 2023-08-22 NOTE — Telephone Encounter (Signed)
 See other mychart encounter.

## 2023-08-22 NOTE — Telephone Encounter (Signed)
 Patient called in again asking for a nerve block. And sent Mychart message.

## 2023-09-13 ENCOUNTER — Other Ambulatory Visit (HOSPITAL_COMMUNITY): Payer: Self-pay

## 2023-09-13 ENCOUNTER — Telehealth: Payer: Self-pay

## 2023-09-13 NOTE — Telephone Encounter (Signed)
 Pharmacy Patient Advocate Encounter  Received notification from [facility] that Prior Authorization for Nurtec 75MG  dispersible tablets has been APPROVED from 09/13/2023 to 09/12/2024. Ran test claim, Copay is $394.09. This test claim was processed through [facility]- copay amounts may vary at other pharmacies due to pharmacy/plan contracts, or as the patient moves through the different stages of their insurance plan.   PA #/Case ID/Reference #: PA Case ID #: [id number]

## 2023-09-13 NOTE — Telephone Encounter (Signed)
 Pharmacy Patient Advocate Encounter   Received notification from CoverMyMeds that prior authorization for Nurtec is required/requested.   Insurance verification completed.   The patient is insured through [facility] .   Per test claim: PA required; PA submitted to above mentioned insurance via CoverMyMeds Key/confirmation #/EOC [id number] Status is pending

## 2023-09-19 ENCOUNTER — Other Ambulatory Visit: Payer: Self-pay | Admitting: Neurology

## 2023-09-19 DIAGNOSIS — K146 Glossodynia: Secondary | ICD-10-CM

## 2023-09-19 MED ORDER — CLONAZEPAM 0.5 MG PO TBDP: ORAL_TABLET | ORAL | 4 refills | Status: DC

## 2023-09-21 ENCOUNTER — Other Ambulatory Visit: Payer: Self-pay | Admitting: Neurology

## 2023-09-21 MED ORDER — PROMETHAZINE HCL 25 MG PO TABS: 25.0000 mg | ORAL_TABLET | Freq: Four times a day (QID) | ORAL | 6 refills | Status: AC | PRN

## 2023-09-21 MED ORDER — HYDROCODONE-ACETAMINOPHEN 10-325 MG PO TABS: 1.0000 | ORAL_TABLET | ORAL | 0 refills | Status: AC | PRN

## 2023-09-21 MED ORDER — LAMOTRIGINE ER 200 MG PO TB24: 200.0000 mg | ORAL_TABLET | Freq: Every day | ORAL | 4 refills | Status: AC

## 2023-09-21 MED ORDER — ATENOLOL 50 MG PO TABS: 50.0000 mg | ORAL_TABLET | Freq: Every day | ORAL | 4 refills | Status: AC

## 2023-10-05 ENCOUNTER — Other Ambulatory Visit: Payer: Self-pay | Admitting: Neurology

## 2023-10-05 MED ORDER — GABAPENTIN 300 MG PO CAPS: 300.0000 mg | ORAL_CAPSULE | Freq: Three times a day (TID) | ORAL | 2 refills | Status: DC | PRN

## 2023-10-06 ENCOUNTER — Other Ambulatory Visit: Payer: Self-pay | Admitting: Neurology

## 2023-10-06 MED ORDER — GABAPENTIN 100 MG PO CAPS: 100.0000 mg | ORAL_CAPSULE | Freq: Three times a day (TID) | ORAL | 1 refills | Status: DC

## 2023-10-20 ENCOUNTER — Telehealth: Payer: Self-pay | Admitting: *Deleted

## 2023-10-20 NOTE — Telephone Encounter (Signed)
 Received message from [facility] that pt needs a follow up appt. Pt sent message to call and schedule.

## 2023-11-09 ENCOUNTER — Other Ambulatory Visit: Payer: Self-pay | Admitting: Neurology

## 2023-11-17 ENCOUNTER — Telehealth: Payer: Self-pay | Admitting: Neurology

## 2023-11-17 DIAGNOSIS — K146 Glossodynia: Secondary | ICD-10-CM

## 2023-11-17 MED ORDER — CLONAZEPAM 0.5 MG PO TBDP: ORAL_TABLET | ORAL | 4 refills | Status: DC

## 2023-11-17 NOTE — Telephone Encounter (Signed)
 Spoke with Dr Ines. She is changing the pt's Rx to 1 tablet up to 4 times daily prn burning mouth syndrome. Swish in mouth and spit out contents.   Spoke with pharmacist. They want to clarify the day's supply. I informed them Dr Ines would be sending a new Rx in anyway with updated instructions.  Will clarify the day's supply in Rx.  Last fills:

## 2023-11-17 NOTE — Telephone Encounter (Signed)
 Signed, thank you

## 2023-11-17 NOTE — Telephone Encounter (Signed)
 Pharmacist asking for a call with the approx daily dose of  clonazePAM  (KLONOPIN ) 0.5 MG disintegrating tablet for insurance purposes , please call pharmacy.

## 2023-11-18 ENCOUNTER — Telehealth: Payer: Self-pay

## 2023-11-18 ENCOUNTER — Other Ambulatory Visit (HOSPITAL_COMMUNITY): Payer: Self-pay

## 2023-11-18 NOTE — Telephone Encounter (Signed)
 Pharmacy Patient Advocate Encounter   Received notification from Fax that prior authorization for clonazePAM  0.5MG  dispersible tablets is required/requested.   Insurance verification completed.   The patient is insured through [facility] .   Per test claim: PA required; PA submitted to above mentioned insurance via CoverMyMeds Key/confirmation #/EOC [id number] Status is pending

## 2023-11-18 NOTE — Telephone Encounter (Signed)
 Pharmacy Patient Advocate Encounter  Received notification from [facility] that Prior Authorization for clonazePAM  0.5MG  dispersible tablets has been APPROVED from 11/18/2023 to 04/18/2024   PA #/Case ID/Reference #: PA Case ID #: [id number]

## 2023-11-22 ENCOUNTER — Encounter: Payer: Self-pay | Admitting: Neurology

## 2023-11-22 ENCOUNTER — Ambulatory Visit (INDEPENDENT_AMBULATORY_CARE_PROVIDER_SITE_OTHER): Admitting: Neurology

## 2023-11-22 VITALS — BP 123/71 | HR 58 | Ht 66.0 in | Wt 137.0 lb

## 2023-11-22 DIAGNOSIS — K146 Glossodynia: Secondary | ICD-10-CM

## 2023-11-22 DIAGNOSIS — G47419 Narcolepsy without cataplexy: Secondary | ICD-10-CM | POA: Diagnosis not present

## 2023-11-22 DIAGNOSIS — F5111 Primary hypersomnia: Secondary | ICD-10-CM | POA: Diagnosis not present

## 2023-11-22 DIAGNOSIS — G521 Disorders of glossopharyngeal nerve: Secondary | ICD-10-CM

## 2023-11-22 DIAGNOSIS — R4020 Unspecified coma: Secondary | ICD-10-CM

## 2023-11-22 DIAGNOSIS — R404 Transient alteration of awareness: Secondary | ICD-10-CM | POA: Diagnosis not present

## 2023-11-22 DIAGNOSIS — R448 Other symptoms and signs involving general sensations and perceptions: Secondary | ICD-10-CM

## 2023-11-22 NOTE — Progress Notes (Signed)
 [facility]    Provider:  Dr Michele [name] Requesting Provider: Shayne Anes, MD Primary Care Provider:  Shayne Anes, MD  CC:  Loss of consciousness.  11/22/2023. Follow up for multiple concerns:  Hypersomnia: Patient here for a new concern. She takes nuvigil , not wakeful, has hypersomnia, past family history of the same in father. She has taken nuvigil  for years and feels more wakeful but had an episode of falling asleep (or loss of consciousness/alteration of awareness unclear was brief though she fell asleep) during a critical event, failure of Nuvigil  Generic. Been on nuvigil  for years. Initially for shift-work disorder. She is very concerned about this event, unclear etiology. See if we can get nuvigil  brand approved She has tried Senosi, Provigil, adderall and other stimulants like ritalin, vyvanse with side effects palpitations. 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  Migraines: She is significantly better on the Vyepti 100mg  for her migraine. Failed multiple medications(see below for extensive updated ist of medications tried)  GI discomfort: She has a tender stomach, has gerd possibl, has had multiple evaluations such as upper and lower endoscopies and colonoscopy.   Low back pain: She also has some chronic low back pain, improved with pain patch.   There is stress/anxiety: She is only working T/W/Th now trying to cut back.   She also has burning mouth syndrome: which is seen more commonly in migraine patients, imaging in the past has been normal however will need to recheck due to episode of LOC/alteration of awareness.   She does not take prn meds often, she has hydrocodone  for example she may take 1/2 of a hydrocodone  with a severe migraine. She takes clonazepam  once a day and does not make her  sedated at all for burning mouth. Even phenergen is prn and only for severe migraines.    Current and past medications updated 11/26/2024  From a thorough review of records and patient report, Medications tried that can be used in migraine/headache management greater than 3 months include: Lifestyle modification, headache diaries, better sleep hygiene, exercise, management of migraine triggers, OTC and prescribed analgesics/nsaids such as ibuprofen, excedrin, alleve and others and the following:  ANALGESICS/NSAIDS::demerol,tylenol ,ultram,vicodin, ibuprofen and various OTC medications (NO medication overuse), Elyxyb , ibuprofen, naproxen, mobic, toradol ANTI-MIGRAINE: axert, cafergot, DHE-45, imitrex tablet/injection, maxalt, midrin. Imitrex, maxalt, She does not tolerate triptans, Reyvow /Lasmiditan  HEART/BP: lopressor, atenolol  (very helpful), propranolol DECONGESTANT/ANTIHISTAMINE: allegra, clarinex, zyrtec ANTI-NAUSEANT: phenergan , vistaril, zofran , compazine (reaction, do not use again), reglan  MUSCLE RELAXANTS: amrix, flexeril, tizanidine, baclofen  ANTI-CONVULSANTS: topamax IR and ER(expressive aphasia), Klonopin , Lamictal , gabapentin  STEROIDS: medrol  dose pack, prednisone SLEEPING PILLS/TRANQUILIZERS:ambien, melatonin, xanax , clonazepam  ANTI-DEPRESSANTS: celexa , cymbalta , effexor  (increased anxiety) pristiq , prozac, viibryd, buspar , pristiq , nortriptyline, amitriptyline, HERBAL: magnesium  citrate (GI upset) CGRP: Ajovy , Emgality, Aimovig, Qulipta , Ubrelvy , Nurtec(best) HORMONAL:Vivelle  Dot OTHER: Memantine  PROCEDURES FOR HEADACHES:Botox , Dry needling, massage, Physical Therapy, nerve blocks Opioids: Norco, Vicodin  Patient complains of symptoms per HPI as well as the following symptoms: none . Pertinent negatives and positives per HPI. All others negative. No other focal neurologic deficits, associated symptoms, inciting events or modifiable factors.   01/13/2023: Patient reports that  Vyepti 100mg  was initially very helpful but it has become inefective and she still has a burden of migraines monthly of 7 migraine days and 10 total headache days a month and worsening that is affecting her  quality of life we will increase vyepti to 300mg  q51months for better efficacy. If we increase Vyepti this will further improve her chronic migraine disorder. She has had >=3 infusions at 100mg .  Nurtec prn  05/27/2022: Incredible response to vyepti; 4 migraines a month and < 10 total headache days a month. The other headaches are more tension across the forehead in the temples may be due to stress. She has a lesion on the right side of her tongue, firm, growing, painful to touch, pain on swallowing, 90mm+ cystic structure that feels firm to touch and radiating throat pain. Go to Dr. Karis. She has hard knots on the left traps. Doing great on vyepti. No other focal neurologic deficits, associated symptoms, inciting events or modifiable factors.  Patient complains of symptoms per HPI as well as the following symptoms: stress . Pertinent negatives and positives per HPI. All others negative    04/01/2021:  sam at [facility]. The last 2 months, on vivelle  brand, she has been doing better. She has had migraines severe for years, very hormonal, she has had significant auras, migraine with and without aura, sometimes will get migraine aura without headache. She goes most of th emonth without a headache. Qulipta  gave her a hedache every morning. When she gets a migraine she doesn't wake up with it but it may develop very early. Nurtec may work if she catches it very early.  She is also had burning tongue, bilaterally, worse with stress, we discussed burning mouth syndrome, we also discussed lots of stress in her work, we discussed maybe trying memantine  in the future, she is tried and failed so many different medications, including all the CGRP's, we talked about Vyepti, she also failed Botox .  At this time  the next thing I would try is memantine  or Vyepti.Burning mouth - sphenopalatine ganglion block, compound lidocaine  nasal spray  Patient complains of symptoms per HPI as well as the following symptoms: stress . Pertinent negatives and positives per HPI. All others negative   11/05/2020: Patient with episodic migraines, doing well on Lamictal  over the last 6 months with 4-7 migraine daya a month.  Patient was doing excellent on Lamictal  and using Nurtec as needed however recently she has had 2 severe headaches 1 of which we saw her in the office for nerve blocks.  This appears to coincide with her estradiol  patch being changed from brand to generic.  We will try and fill out a PA for brand and in the meantime she can wear a higher dose of patch.  We did discuss increased risk of stroke in women with migraine with aura and the risks of increased stroke with estradiol  patch.  Medications tried: Tylenol , Excedrin, atenolol , baclofen , Botox  x10, Ubrelvy , trokendi , tramadol, nurtec, oxycodone , ondansetron , reglan , medrol  dosepak, lamictal , Emgality, Aimovig, hydrocodone , cannot tolerate triptans, reglan , phenergan , nurtec, ubrelvy , trokendi , effexor  Ajovy , effexor , cymbalta , pristiq ,celexa , Lamictal , botox ,baclofen ,atenolol , the dot changed to generic, nortriptyline, amitriptyline, topiramate , Zofran , Trokendi , axert, cafergot, DHE-45, imitrex tablet/injection, maxalt, midrin. She tells me she does not tolerate triptans well. Lamictal , Qulipta , Vivelle  dot, Try elyxyb  and reyvow  acutely  Try: Trudhesa, Memantine , candesartan, vyepti, cymbalta , depakote   Addendum 08/27/2018: Patient continues to suffer with uncharacteristic severe headaches and migraines which have been ongoing for months.  Despite adequate treatment, nerve blocks, increasing atenolol , migraine infusions, CGRP she continues to worsen she is having chronic daily headaches now, her vision is worsening becoming more blurry with diplopia, the  headaches are becoming progressively more positional and  exertional and occipital.  Patient needs an MRI of the brain with and without contrast as soon as possible.  Interval history 07/24/2018: Patient is being seen today in the office due to intractable severe headache and status migrainosus.  This is a very nice 67 year old patient with years of migraines (see below for history) and she is failed a tremendous amount of medications most first and second line migraine preventatives as well as many acute management medications such as triptans (for a full list see below).  She appears very uncomfortable today, she says her migraine is 10 out of 10.  Today we discussed multiple options, first was ways to break her migraine.  We also discussed acute management of her migraines in the future, and preventative medication.  Botox  is the most appropriate way to proceed.  She has failed to CGRP medication and is currently not on them.  For more details on her migraines see history of present illness below.  Virtual Visit via Video Note 07/20/2018  I connected with@ on 11/27/23 at  4:00 PM EDT by a video enabled telemedicine application and verified that I am speaking with the correct person using two identifiers. Patient is at her work office and physician is in the office as well.    I discussed the limitations of evaluation and management by telemedicine and the availability of in person appointments. The patient expressed understanding and agreed to proceed.  Michele KATHEE Epp, MD  HPI:  Michele [name] is a 68 y.o. female here as requested by Michele Anes, MD for migraines. Patient has had migraines for years and has tried many medications. Currently, she has had a 9-week migraine that waxes and wanes but is daily. She has missed work seeing patients 3 times She has been in status migrainosus in the past but  this is the first time she has had a migraine this long and intractable. The last time she had imaging was  in 2018 and normal. The migraine quality can be different, she does not have auras and she has tried all the triptans. Migraines are unilateral through the eye, pulsating and pounding, photo/phonophobia, movement makes it worse. Light is a trigger. Other times they can be all over the head. +nausea often, when she does get nausea she is incapacitated 3x in the last 9 weeks. Migraines are severe or moderately severe.  Atenolol  helps.  She has 18 headache days a month on average and 10 migraine days a month for longer than one year. Over the last 9 weeks daily migraines. She tried aimovig for 3 months last fall. She tried Emgality 2 doses and then the headache started for 9 weeks. She is not on CGRP at this time. She tried baclofen  and compazine  but could not tolerate it. Compazine  made her stomach feel worse. Toradol makes her sick. She is not tolerant of any of the triptans. No medication overuse. Migraines can last a minimum of 24 hours.  No other focal neurologic deficits, associated symptoms, inciting events or modifiable factors.   Meds tried (this is not a complete list) : Zofran , tizanidine, atenolol , celexa , topiramate , propranolol, lopressor, phenergan , flexeril, lamictal , cymbalta , effexor , prozac, viibryd  Reviewed notes, labs and imaging from outside physicians, which showed:  She also takes Zofran  4mg  for nausea Tizanidine 2mg  for aches and pains and is helpful Atenolol  50mg  Celexa  40mg  QD     Review of Systems: Patient complains of symptoms per HPI as well as the following symptoms: headaches . Pertinent negatives  and positives per HPI. All others negative    Social History   Socioeconomic History   Marital status: Married    Spouse name: Not on file   Number of children: 2   Years of education: Not on file   Highest education level: Master's degree (e.g., MA, MS, MEng, MEd, MSW, MBA)  Occupational History   Occupation: NP  Tobacco Use   Smoking status: Never   Smokeless  tobacco: Never  Vaping Use   Vaping status: Never Used  Substance and Sexual Activity   Alcohol use: Never   Drug use: Never   Sexual activity: Not on file  Other Topics Concern   Not on file  Social History Narrative   Lives at home with spouse   Right handed   Caffeine: 2 cups daily    Social Drivers of Corporate investment banker Strain: Not on file  Food Insecurity: Not on file  Transportation Needs: Not on file  Physical Activity: Not on file  Stress: Not on file  Social Connections: Not on file  Intimate Partner Violence: Not on file    Family History  Problem Relation Age of Onset   Lung cancer Father    Headache Father    Other Sister        ureter tumor, smoking related   Allergic Disorder Brother    Anxiety disorder Brother    Colon cancer Neg Hx    Colon polyps Neg Hx    Esophageal cancer Neg Hx    Rectal cancer Neg Hx    Migraines Neg Hx     Past Medical History:  Diagnosis Date   Allergy    seasonal   Anxiety    Chronic headaches    Dyspareunia in female    GERD (gastroesophageal reflux disease)    prn   Greater trochanteric bursitis    bilateral   Hypersomnia    Migraines    Vitamin D deficiency    in the past per pt    Patient Active Problem List   Diagnosis Date Noted   Chronic migraine without aura, intractable, with status migrainosus 01/29/2023   Status migrainosus 06/01/2021   Chronic migraine without aura, with intractable migraine, so stated, with status migrainosus 04/12/2021   Burning mouth syndrome 04/12/2021   Migraine with aura and with status migrainosus, not intractable 01/05/2021   GERD (gastroesophageal reflux disease) 09/22/2020   Spinal stenosis of lumbar region 04/27/2020   Migraine without aura and with status migrainosus, not intractable 07/24/2018    Past Surgical History:  Procedure Laterality Date   COLONOSCOPY     endoscopy     LAPAROSCOPIC TOTAL HYSTERECTOMY     UPPER GASTROINTESTINAL ENDOSCOPY      WISDOM TOOTH EXTRACTION      Current Outpatient Medications  Medication Sig Dispense Refill   Armodafinil  250 MG tablet Take 125 mg by mouth 2 (two) times daily.     atenolol  (TENORMIN ) 50 MG tablet Take 1 tablet (50 mg total) by mouth daily. 90 tablet 4   clonazePAM  (KLONOPIN ) 0.5 MG disintegrating tablet Place 1 dissolving tablet in the mouth, let it melt and distribute around the burning areas, spit out contents. Can take up to 4x a day based on tolerance as needed for burning mouth syndrome. 120 tablet 4   Eptinezumab-jjmr (VYEPTI) 100 MG/ML injection 300 mg every 3 (three) months.     gabapentin  (NEURONTIN ) 100 MG capsule Take 1 capsule (100 mg total) by mouth 3 (three) times  daily. 180 capsule 1   HYDROcodone -acetaminophen  (NORCO) 10-325 MG tablet Take 1-2 tablets by mouth every 4 (four) hours as needed for severe pain (pain score 7-10). 56 tablet 0   LamoTRIgine  200 MG TB24 24 hour tablet Take 1 tablet (200 mg total) by mouth at bedtime. 180 tablet 4   NUVIGIL  250 MG tablet Take 1 tablet (250 mg total) by mouth daily. Brand only 30 tablet 4   ondansetron  (ZOFRAN -ODT) 8 MG disintegrating tablet Take 1 tablet (8 mg total) by mouth every 8 (eight) hours as needed for nausea or vomiting. 30 tablet 0   promethazine  (PHENERGAN ) 25 MG tablet Take 1 tablet (25 mg total) by mouth every 6 (six) hours as needed for nausea or vomiting. 90 tablet 6   Rimegepant Sulfate (NURTEC) 75 MG TBDP Take 1 tablet (75 mg total) by mouth daily as needed. For migraines. Take as close to onset of migraine as possible. One daily maximum. 16 tablet 11   VIVELLE -DOT 0.075 MG/24HR Place 1 patch onto the skin 2 (two) times a week. 8 patch 12   No current facility-administered medications for this visit.    Allergies as of 11/22/2023   (No Known Allergies)    Vitals: BP 123/71   Pulse (!) 58   Ht 5' 6 (1.676 m)   Wt 137 lb (62.1 kg)   BMI 22.11 kg/m  Last Weight:  Wt Readings from Last 1 Encounters:   11/22/23 137 lb (62.1 kg)   Last Height:   Ht Readings from Last 1 Encounters:  11/22/23 5' 6 (1.676 m)    Physical exam: Exam: Gen: NAD, conversant      CV: No palpitations or chest pain or SOB. VS: Breathing at a normal rate. Weight appears within normal limits. Not febrile. Eyes: Conjunctivae clear without exudates or hemorrhage  Neuro: Detailed Neurologic Exam  Speech:    Speech is normal; fluent and spontaneous with normal comprehension.  Cognition:    The patient is oriented to person, place, and time;     recent and remote memory intact;     language fluent;     normal attention, concentration, fund of knowledge Cranial Nerves:    The pupils are equal, round, and reactive to light. Visual fields are full Extraocular movements are intact.  The face is symmetric with normal sensation. The palate elevates in the midline. Hearing intact. Voice is normal. Shoulder shrug is normal. The tongue has normal motion without fasciculations.   Coordination: normal  Gait:    No abnormalities noted or reported  Motor Observation:   no involuntary movements noted. Tone:    Appears normal  Posture:    Posture is normal. normal erect    Strength:    Strength is anti-gravity and symmetric in the upper and lower limbs.      Sensation: intact to LT, no reports of numbness or tingling or paresthesias      Assessment/Plan:  68 year old patient with intractable chronic migraines without aura, primary idiopathic hypersomnia, burning mouth syndrome  Hypersomnia/Narcolepsy:  Patient here for a new concern. She takes nuvigil , not wakeful, hypersomnia, father was the same. She has taken nuvigil  for years and feels more wakeful but had an episode of falling asleep during a critical event, failure of Nuvigil  Generic. Been on nuvigil  for years.  Needs Nuvigil  Brand only, failure of generic, also failed multiple other medications including: Senosi, Provigil, adderall and other stimulants  like ritalin, vyvanse with side effects palpitations; See if we  can get nuvigil  brand approved   Will check a Narcolepsy genetic panel. She does not feel a sleep study/MSLT would be helpful and I agree I believe this is idiopathic hypersomnia however should she ever like this I can refer to our sleep team. Also MRI of the brain for other causes of AMS/LOC/Alteration of awareness such as stroke, seizure focus and also order EEG routine  Intractable Migraines:  failed CGRP(Aimovig, Emgality, AAjovy), Botox  and a plethora of medications(see HPI for extensive list of medications listed). Doing GREAT on vyepti.  Continue on current regimen of atenolol , Lamictal , Zofran , Nurtec, for severe migraines I do give her hydrocodone  that she uses rarely, she is on an estradiol  patch and pays for brand we were unable to get that approved for the prior Auth. 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 Generic failed(tried increasing generic dose as well) and does not tolerate generic with side effects, needs BRAND only.  Again we have discussed increased risks of stroke.we were unable to get that approved for the prior Auth. On Vepti on 6 migraine days a month and < 12 total headache days a month, continue Nurtec  Burning Mouth Syndrome Clonazepam . Tried multiple other medications. 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    Burning Mouth Syndrome Clonazepam . Tried multiple other medications. 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    PRN Meds:  She does not take prn meds often, she has hydrocodone  for example she may take 1/2 of a hydrocodone  with a severe migraine. She takes clonazepam  once a day for burning mouth and does not make her sedated at all for burning mouth. Even phenergen is prn and only for severe migraines.   Medications tried that can be used in migraine/headache management greater than 3  months include:  From a thorough review of records and patient report,  Lifestyle modification, headache diaries, better sleep hygiene, exercise, management of migraine triggers, OTC and prescribed analgesics/nsaids such as ibuprofen, excedrin, alleve and others and the following:  ANALGESICS/NSAIDS::demerol,tylenol ,ultram,vicodin, ibuprofen and various OTC medications (NO medication overuse), Elyxyb , ibuprofen, naproxen, mobic, toradol ANTI-MIGRAINE: axert, cafergot, DHE-45, imitrex tablet/injection, maxalt, midrin. Imitrex, maxalt, She does not tolerate triptans, Reyvow /Lasmiditan  HEART/BP: lopressor, atenolol  (very helpful), propranolol DECONGESTANT/ANTIHISTAMINE: allegra, clarinex, zyrtec ANTI-NAUSEANT: phenergan , vistaril, zofran , compazine (reaction, do not use again), reglan  MUSCLE RELAXANTS: amrix, flexeril, tizanidine, baclofen  ANTI-CONVULSANTS: topamax IR and ER(expressive aphasia), Klonopin , Lamictal , gabapentin  STEROIDS: medrol  dose pack, prednisone SLEEPING PILLS/TRANQUILIZERS:ambien, melatonin, xanax , clonazepam  ANTI-DEPRESSANTS: celexa , cymbalta , effexor  (increased anxiety) pristiq , prozac, viibryd, buspar , pristiq , nortriptyline, amitriptyline, HERBAL: magnesium  citrate (GI upset) CGRP: Ajovy , Emgality, Aimovig, Qulipta , Ubrelvy , Nurtec(best) HORMONAL:Vivelle  Dot OTHER: Memantine  PROCEDURES FOR HEADACHES:Botox , Dry needling, massage, Physical Therapy, nerve blocks Opioids: Norco, Vicodin Orders Placed This Encounter  Procedures   MR BRAIN W WO CONTRAST   NARCOLEPSY EVALUATION   EEG adult    Meds ordered this encounter  Medications   NUVIGIL  250 MG tablet    Sig: Take 1 tablet (250 mg total) by mouth daily. Brand only    Dispense:  30 tablet    Refill:  4      Michele Epp, MD  [facility] [address] [place], [place] [address]  Phone [phone] Fax [phone]  I spent 50 minutes of face-to-face and non-face-to-face  time with patient on the  1. Transient alteration of awareness   2. Screen for primary narcolepsy without cataplexy   3. Primary hypersomnia   4. Loss of  consciousness (HCC)   5. Glossopharyngeal neuralgia   6. Burning mouth syndrome   7. Paresthesia of tongue     diagnosis.  This included previsit chart review, lab review, study review, order entry, electronic health record documentation, patient education on the different diagnostic and therapeutic options, counseling and coordination of care, risks and benefits of management, compliance, or risk factor reduction

## 2023-11-22 NOTE — Patient Instructions (Addendum)
 Narcolepsy, a neurological sleep disorder, can be associated with genetic factors, particularly variations in the HLA-DQB1 gene. While a blood test for the HLA-DQB1*06:02 marker can support a narcolepsy diagnosis, it's not definitive, as a significant portion of the population without narcolepsy also carries this marker. Other diagnostic tests, including sleep studies and cerebrospinal fluid analysis, are also crucial for diagnosis  Assessment/Plan:  68 year old patient with intractable chronic migraines without aura, primary idiopathic hypersomnia, burning mouth syndrome  Hypersomnia/Narcolepsy:  Patient here for a new concern. She takes nuvigil , not wakeful, hypersomnia, father was the same. She has taken nuvigil  for years and feels more wakeful but had an episode of falling asleep during a critical event, failure of Nuvigil  Generic. Been on nuvigil  for years.  Needs Nuvigil  Brand only, failure of generic, also failed multiple other medications including: Senosi, Provigil, adderall and other stimulants like ritalin, vyvanse with side effects palpitations; See if we can get nuvigil  brand approved   Will check a Narcolepsy genetic panel. She does not feel a sleep study/MSLT would be helpful and I agree I believe this is idiopathic hypersomnia however should she ever like this I can refer to our sleep team. Also MRI of the brain for other causes of AMS/LOC/Alteration of awareness such as stroke, seizure focus and also order EEG routine  Intractable Migraines:  failed CGRP(Aimovig, Emgality, AAjovy), Botox  and a plethora of medications(see HPI for extensive list of medications listed). Doing GREAT on vyepti.  Continue on current regimen of atenolol , Lamictal , Zofran , Nurtec, for severe migraines I do give her hydrocodone  that she uses rarely, she is on an estradiol  patch and pays for brand we were unable to get that approved for the prior Auth. 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 Generic failed(tried increasing generic dose as well) and does not tolerate generic with side effects, needs BRAND only.  Again we have discussed increased risks of stroke.we were unable to get that approved for the prior Auth. On Vepti on 6 migraine days a month and < 12 total headache days a month, continue Nurtec  Burning Mouth Syndrome Clonazepam . Tried multiple other medications. 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    PRN Meds:  She does not take prn meds often, she has hydrocodone  for example she may take 1/2 of a hydrocodone  with a severe migraine. She takes clonazepam  once a day for burning mouth and does not make her sedated at all for burning mouth. Even phenergen is prn and only for severe migraines.    From a thorough review of records and patient report, Medications tried that can be used in migraine/headache management greater than 3 months include: Lifestyle modification, headache diaries, better sleep hygiene, exercise, management of migraine triggers, OTC and prescribed analgesics/nsaids such as ibuprofen, excedrin, alleve and others and the following:  ANALGESICS/NSAIDS::demerol,tylenol ,ultram,vicodin, ibuprofen and various OTC medications (NO medication overuse), Elyxyb , ibuprofen, naproxen, mobic, toradol ANTI-MIGRAINE: axert, cafergot, DHE-45, imitrex tablet/injection, maxalt, midrin. Imitrex, maxalt, She does not tolerate triptans, Reyvow /Lasmiditan  HEART/BP: lopressor, atenolol  (very helpful), propranolol DECONGESTANT/ANTIHISTAMINE: allegra, clarinex, zyrtec ANTI-NAUSEANT: phenergan , vistaril, zofran , compazine (reaction, do not use again), reglan  MUSCLE RELAXANTS: amrix, flexeril, tizanidine, baclofen  ANTI-CONVULSANTS: topamax IR and ER(expressive aphasia), Klonopin , Lamictal , gabapentin  STEROIDS: medrol  dose pack, prednisone SLEEPING PILLS/TRANQUILIZERS:ambien, melatonin, xanax ,  clonazepam  ANTI-DEPRESSANTS: celexa , cymbalta , effexor  (increased anxiety) pristiq , prozac, viibryd, buspar , pristiq , nortriptyline, amitriptyline, HERBAL: magnesium  citrate (GI upset) CGRP: Ajovy , Emgality, Aimovig, Qulipta , Ubrelvy , Nurtec(best) HORMONAL:Vivelle  Dot OTHER: Memantine  PROCEDURES FOR  HEADACHES:Botox , Dry needling, massage, Physical Therapy, nerve blocks Opioids: Norco, Vicodin  Orders Placed This Encounter  Procedures   MR BRAIN W WO CONTRAST   NARCOLEPSY EVALUATION   EEG adult    Meds ordered this encounter  Medications   NUVIGIL  250 MG tablet    Sig: Take 1 tablet (250 mg total) by mouth daily. Brand only    Dispense:  30 tablet    Refill:  4

## 2023-11-27 MED ORDER — NUVIGIL 250 MG PO TABS: 250.0000 mg | ORAL_TABLET | Freq: Every day | ORAL | 4 refills | Status: AC

## 2023-11-30 ENCOUNTER — Telehealth: Payer: Self-pay | Admitting: Neurology

## 2023-11-30 NOTE — Telephone Encounter (Signed)
 no auth requied sent to [facility] for an open MRI. [phone]

## 2023-12-01 NOTE — Telephone Encounter (Signed)
 I sent a message to [facility] today to check on the MRI getting scheduled but I haven't heard back. Dr. Ines said we can also schedule her in the wide bore machine at GI so I changed the location to there on her order so they can schedule her. No auth required for her insurance so wherever she gets scheduled is ok. [facility] phone number is [phone].

## 2023-12-05 ENCOUNTER — Encounter: Payer: Self-pay | Admitting: Neurology

## 2023-12-05 NOTE — Telephone Encounter (Signed)
 Please just give her a call on her cell at [phone] just to try again. I also sent her a text and I will send her a mychart thanks

## 2023-12-05 NOTE — Telephone Encounter (Signed)
 [facility] sent me a message back they left her a voice mail on 8/14 to schedule and [facility] left a voice mail on 8/15.

## 2023-12-12 ENCOUNTER — Ambulatory Visit
Admission: RE | Admit: 2023-12-12 | Discharge: 2023-12-12 | Disposition: A | Discharge status: Home / Self Care | Source: Ambulatory Visit | Attending: Neurology | Admitting: Neurology

## 2023-12-12 DIAGNOSIS — G521 Disorders of glossopharyngeal nerve: Secondary | ICD-10-CM | POA: Diagnosis not present

## 2023-12-12 DIAGNOSIS — K146 Glossodynia: Secondary | ICD-10-CM

## 2023-12-12 DIAGNOSIS — R4020 Unspecified coma: Secondary | ICD-10-CM

## 2023-12-12 DIAGNOSIS — R404 Transient alteration of awareness: Secondary | ICD-10-CM

## 2023-12-12 DIAGNOSIS — R448 Other symptoms and signs involving general sensations and perceptions: Secondary | ICD-10-CM

## 2023-12-12 DIAGNOSIS — F5111 Primary hypersomnia: Secondary | ICD-10-CM

## 2023-12-12 MED ORDER — GADOPICLENOL 0.5 MMOL/ML IV SOLN
6.0000 mL | Freq: Once | INTRAVENOUS | Status: AC | PRN
  Administered 2023-12-12: 6 mL via INTRAVENOUS

## 2023-12-13 ENCOUNTER — Ambulatory Visit: Payer: Self-pay | Admitting: Neurology

## 2023-12-14 ENCOUNTER — Encounter: Payer: Self-pay | Admitting: Neurology

## 2023-12-15 ENCOUNTER — Telehealth: Payer: Self-pay | Admitting: *Deleted

## 2023-12-15 NOTE — Telephone Encounter (Signed)
 Please do PA for Brand name Nuvigil  as pt has failed generic. (Armodafinil ).

## 2023-12-16 ENCOUNTER — Telehealth: Payer: Self-pay

## 2023-12-16 ENCOUNTER — Other Ambulatory Visit (HOSPITAL_COMMUNITY): Payer: Self-pay

## 2023-12-16 NOTE — Telephone Encounter (Addendum)
 Pharmacy Patient Advocate Encounter   Received notification from Physician's Office that prior authorization for Nuvigil  is required/requested.   Insurance verification completed.   The patient is insured through [employer] .   Per test claim: The current 30 day co-pay is, $344.25.  No PA needed at this time. This test claim was processed through [facility]- copay amounts may vary at other pharmacies due to pharmacy/plan contracts, or as the patient moves through the different stages of their insurance plan.    PT already has an active PA on file for BRAND good thru 07/10/2024

## 2023-12-20 ENCOUNTER — Other Ambulatory Visit: Payer: Self-pay | Admitting: *Deleted

## 2023-12-20 MED ORDER — GABAPENTIN 100 MG PO CAPS: 100.0000 mg | ORAL_CAPSULE | Freq: Three times a day (TID) | ORAL | 1 refills | Status: AC

## 2023-12-20 NOTE — Telephone Encounter (Signed)
 Notified patient.

## 2023-12-20 NOTE — Telephone Encounter (Signed)
 [facility] faxed us  an Rx request for Gabapentin  100 mg take by mouth 3 times daily. Last fill 12/17/23. Need refills to place on file.

## 2023-12-21 ENCOUNTER — Encounter: Payer: Self-pay | Admitting: Neurology

## 2023-12-26 ENCOUNTER — Other Ambulatory Visit: Admitting: *Deleted

## 2024-01-09 ENCOUNTER — Encounter: Payer: Self-pay | Admitting: Neurology

## 2024-01-26 ENCOUNTER — Telehealth: Payer: Self-pay | Admitting: Neurology

## 2024-01-26 NOTE — Telephone Encounter (Signed)
 When pt has acute migraine,  we have them contact pcp, or refer to [facility] urgent care where they can do in office or virtual visits with patients.  Unfortunately we are not doing occipital nerve blocks as you said and referring out.  We are sorry.  I will message her in [place], but you can follow up as she needs reassigning.

## 2024-01-26 NOTE — Telephone Encounter (Signed)
 Patient stated she has had a headache since last week and a migraine Tuesday and today and is asking if we can get someone to do an occipital nerve block. I know we are [facility]. Is there anything we can do for the patient today?

## 2024-04-25 ENCOUNTER — Other Ambulatory Visit: Payer: Self-pay

## 2024-04-25 DIAGNOSIS — K146 Glossodynia: Secondary | ICD-10-CM

## 2024-04-25 MED ORDER — CLONAZEPAM 0.5 MG PO TBDP: ORAL_TABLET | ORAL | 4 refills | Status: AC

## 2024-04-25 NOTE — Telephone Encounter (Signed)
 Requested Prescriptions   Pending Prescriptions Disp Refills   clonazePAM  (KLONOPIN ) 0.5 MG disintegrating tablet 120 tablet 4    Sig: Place 1 dissolving tablet in the mouth, let it melt and distribute around the burning areas, spit out contents. Can take up to 4x a day based on tolerance as needed for burning mouth syndrome.   Last seen 11/22/23 with AHERN Next appt 05/21/24 with SATER  Dispenses   Dispensed Days Supply Quantity Provider Pharmacy  CLONAZEP ODT TAB 0.5MG  04/21/2024 30 90 tablet Ines Onetha NOVAK, MD [facility] - ...  clonazepam  0.5 mg disintegrating tablet 03/22/2024 30 90 each Ines Onetha NOVAK, MD [facility] - ...  clonazepam  0.5 mg disintegrating tablet 02/22/2024 30 90 each Ines Onetha NOVAK, MD [facility] - ...  clonazepam  0.5 mg disintegrating tablet 01/21/2024 30 90 each Ines Onetha NOVAK, MD [facility] - ...  clonazepam  0.5 mg disintegrating tablet 12/17/2023 30 90 each Ines Onetha NOVAK, MD [facility] - ...  clonazepam  0.5 mg disintegrating tablet 11/17/2023 30 90 each Ines Onetha NOVAK, MD [facility] - ...  clonazepam  0.5 mg disintegrating tablet 09/15/2023 60 180 each Ines Onetha NOVAK, MD [facility] - ...  clonazepam  0.5 mg disintegrating tablet 07/19/2023 60 180 each Ines Onetha NOVAK, MD [facility] - ...  clonazepam  0.5 mg disintegrating tablet 05/26/2023 23 180 each Ines Onetha NOVAK, MD [facility] - ...  clonazepam  0.5 mg disintegrating tablet 05/19/2023 5 30 each Ines Onetha NOVAK, MD [facility] - .[place]

## 2024-05-02 ENCOUNTER — Telehealth: Payer: Self-pay | Admitting: Neurology

## 2024-05-02 NOTE — Telephone Encounter (Signed)
 Pt is aware of her upcoming infusion appointment on Monday, also aware of her Feb appointment with Dr Vear.  Pt is asking if Dr Vear will increase her  Eptinezumab-jjmr (VYEPTI) 100 MG/ML injection  to 300mg , she has had it before, she is having break thru migraines as to why she is asking, please call(message routed to [facility] for Dr Vear).

## 2024-05-03 NOTE — Telephone Encounter (Signed)
 Pt will need the OV so we can update notes and rationale as to why increased Vyepti dose is requested.

## 2024-05-03 NOTE — Telephone Encounter (Signed)
 I called and spoke with patient explaining we would need updated OV before we can increase dose. She understood and said that's fine. I did not see a sooner appt with Dr Vear at this time and she is already due for her next infusion. She said she would go ahead with this infusion. She thanked me for the call.

## 2024-05-21 ENCOUNTER — Ambulatory Visit: Admitting: Neurology

## 2024-05-21 ENCOUNTER — Telehealth: Payer: Self-pay | Admitting: Neurology

## 2024-05-21 NOTE — Telephone Encounter (Signed)
 Closed due to weather.
